# Patient Record
Sex: Male | Born: 1940
Health system: Southern US, Community
[De-identification: ages and names within clinical notes are randomized; demographics above are authoritative.]

## PROBLEM LIST (undated history)

## (undated) DIAGNOSIS — F32A Depression, unspecified: Secondary | ICD-10-CM

## (undated) DIAGNOSIS — M1990 Unspecified osteoarthritis, unspecified site: Secondary | ICD-10-CM

## (undated) DIAGNOSIS — F329 Major depressive disorder, single episode, unspecified: Secondary | ICD-10-CM

## (undated) DIAGNOSIS — E785 Hyperlipidemia, unspecified: Secondary | ICD-10-CM

## (undated) DIAGNOSIS — E119 Type 2 diabetes mellitus without complications: Secondary | ICD-10-CM

## (undated) DIAGNOSIS — K219 Gastro-esophageal reflux disease without esophagitis: Secondary | ICD-10-CM

## (undated) DIAGNOSIS — I10 Essential (primary) hypertension: Secondary | ICD-10-CM

## (undated) DIAGNOSIS — N319 Neuromuscular dysfunction of bladder, unspecified: Secondary | ICD-10-CM

## (undated) HISTORY — PX: SKIN GRAFT SPLIT THICKNESS LEG / FOOT: SUR1303

---

## 2008-04-20 ENCOUNTER — Ambulatory Visit (HOSPITAL_BASED_OUTPATIENT_CLINIC_OR_DEPARTMENT_OTHER): Admission: RE | Admit: 2008-04-20 | Discharge: 2008-04-20 | Payer: Self-pay | Admitting: Urology

## 2008-04-27 ENCOUNTER — Ambulatory Visit: Payer: Self-pay | Admitting: Surgery

## 2008-06-16 ENCOUNTER — Ambulatory Visit: Payer: Self-pay | Admitting: Pulmonary Disease

## 2008-06-16 DIAGNOSIS — E119 Type 2 diabetes mellitus without complications: Secondary | ICD-10-CM | POA: Insufficient documentation

## 2008-06-16 DIAGNOSIS — E785 Hyperlipidemia, unspecified: Secondary | ICD-10-CM | POA: Insufficient documentation

## 2008-06-16 DIAGNOSIS — G4733 Obstructive sleep apnea (adult) (pediatric): Secondary | ICD-10-CM

## 2008-07-11 ENCOUNTER — Ambulatory Visit (HOSPITAL_BASED_OUTPATIENT_CLINIC_OR_DEPARTMENT_OTHER): Admission: RE | Admit: 2008-07-11 | Discharge: 2008-07-11 | Payer: Self-pay | Admitting: Pulmonary Disease

## 2008-07-11 ENCOUNTER — Encounter: Payer: Self-pay | Admitting: Pulmonary Disease

## 2008-07-27 ENCOUNTER — Encounter: Admission: RE | Admit: 2008-07-27 | Discharge: 2008-07-27 | Payer: Self-pay | Admitting: Surgery

## 2008-07-27 ENCOUNTER — Ambulatory Visit: Payer: Self-pay | Admitting: Surgery

## 2008-08-03 ENCOUNTER — Ambulatory Visit: Payer: Self-pay | Admitting: Pulmonary Disease

## 2008-08-11 ENCOUNTER — Ambulatory Visit: Payer: Self-pay | Admitting: Pulmonary Disease

## 2008-09-13 ENCOUNTER — Ambulatory Visit: Payer: Self-pay | Admitting: Pulmonary Disease

## 2008-09-17 ENCOUNTER — Encounter: Payer: Self-pay | Admitting: Pulmonary Disease

## 2008-10-20 ENCOUNTER — Telehealth (INDEPENDENT_AMBULATORY_CARE_PROVIDER_SITE_OTHER): Payer: Self-pay | Admitting: *Deleted

## 2008-10-22 ENCOUNTER — Ambulatory Visit: Payer: Self-pay | Admitting: Pulmonary Disease

## 2008-10-29 ENCOUNTER — Telehealth: Payer: Self-pay | Admitting: Pulmonary Disease

## 2009-04-05 ENCOUNTER — Ambulatory Visit: Payer: Self-pay | Admitting: Surgery

## 2009-04-05 ENCOUNTER — Encounter: Admission: RE | Admit: 2009-04-05 | Discharge: 2009-04-05 | Payer: Self-pay | Admitting: Surgery

## 2010-04-04 ENCOUNTER — Encounter: Admission: RE | Admit: 2010-04-04 | Discharge: 2010-04-04 | Payer: Self-pay | Admitting: Surgery

## 2010-04-11 ENCOUNTER — Ambulatory Visit: Payer: Self-pay | Admitting: Surgery

## 2010-07-28 IMAGING — CT CT CHEST W/O CM
2 of 3 series · 13 of 30 positions shown, 15 images · non-contrast
Comparison: CT chest of 04/05/2009 and CT chest of 07/27/2008

CLINICAL DATA: Follow up of right lung nodule

CT CHEST WITHOUT CONTRAST
TECHNIQUE: Multidetector CT imaging of the chest was performed
following the standard protocol without IV contrast.

[Series 3: routine chest · axial · 0.78mm/px · z∈[-195,-15]mm · 5 of 55 slices shown, 7 images]
[im 10/55  mediastinal]
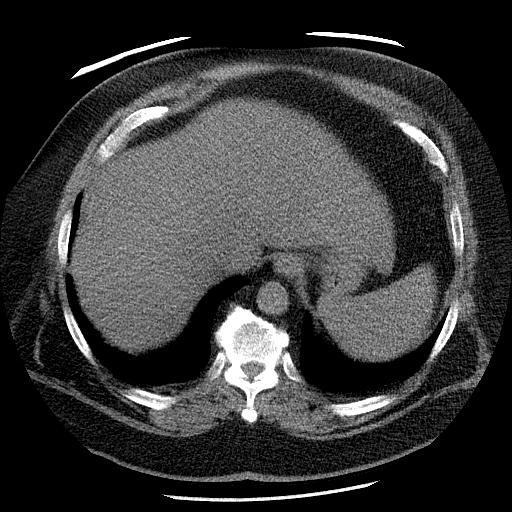
[im 10/55  lung]
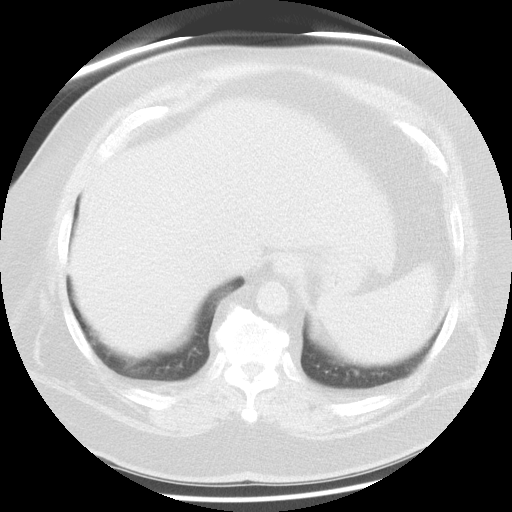
[im 19/55  lung]
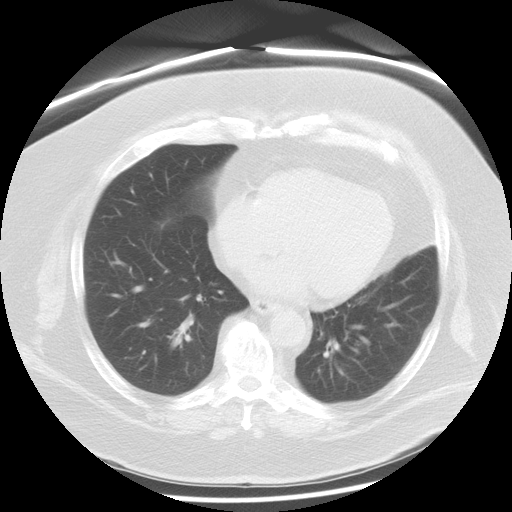
[im 28/55  lung]
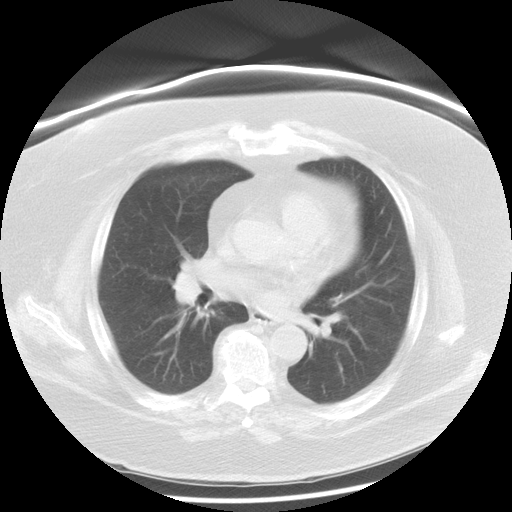
[im 37/55  lung]
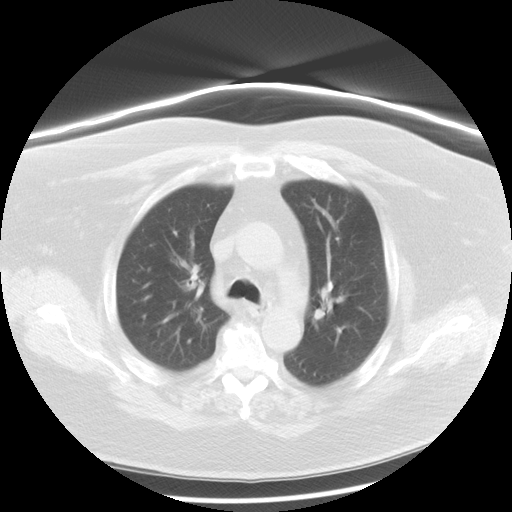
[im 46/55  mediastinal]
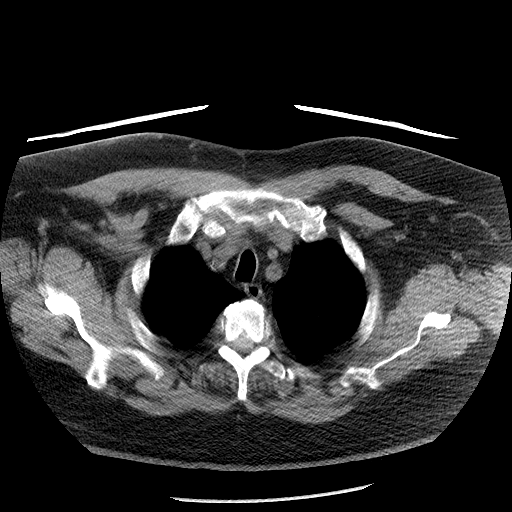
[im 46/55  lung]
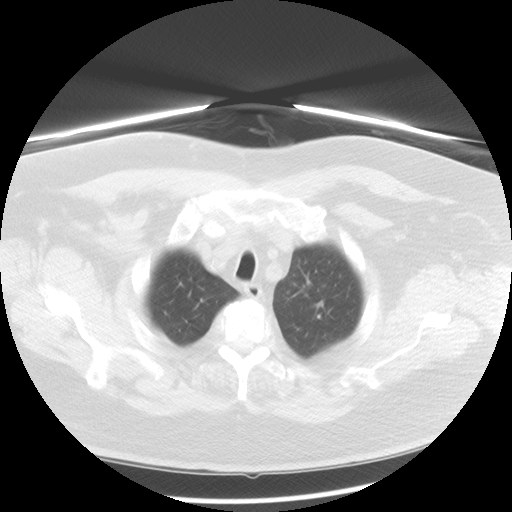

[Series 602: sagittal body · sagittal · 0.78mm/px · 8 of 161 slices shown]
[im 17/161  mediastinal]
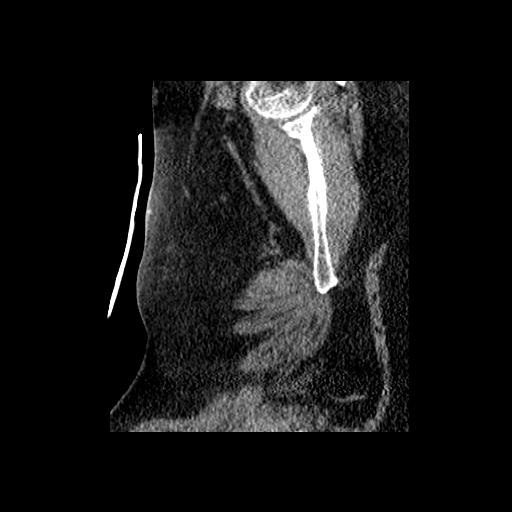
[im 33/161  mediastinal]
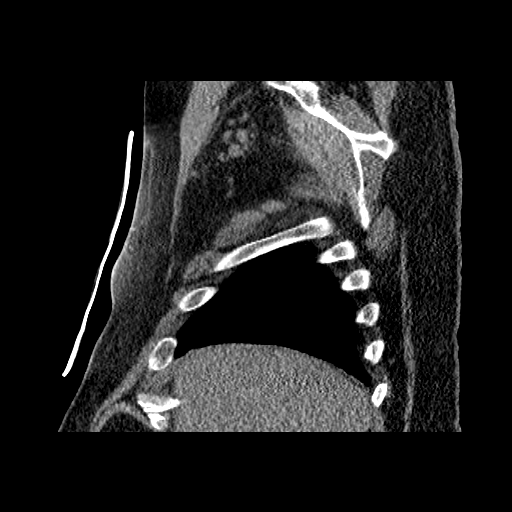
[im 49/161  mediastinal]
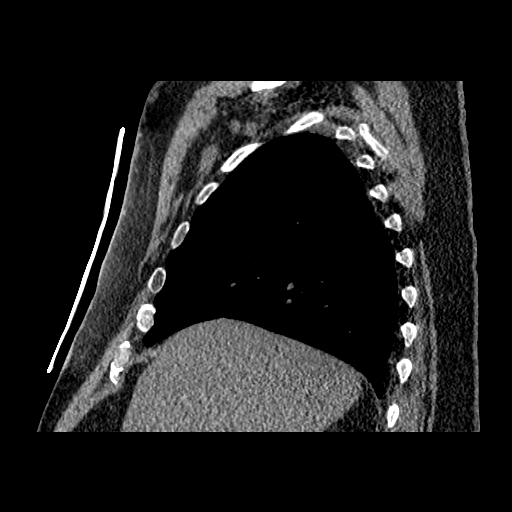
[im 73/161  mediastinal]
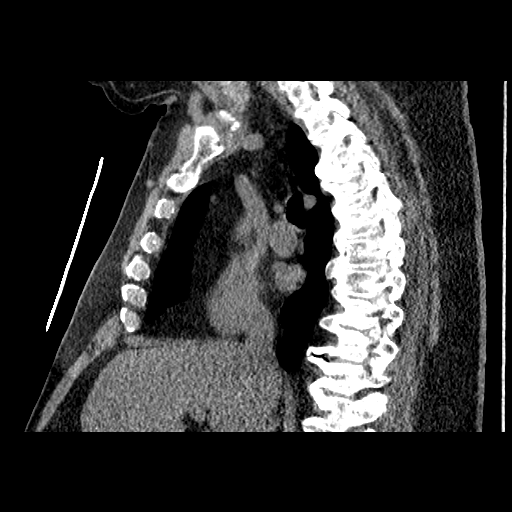
[im 89/161  mediastinal]
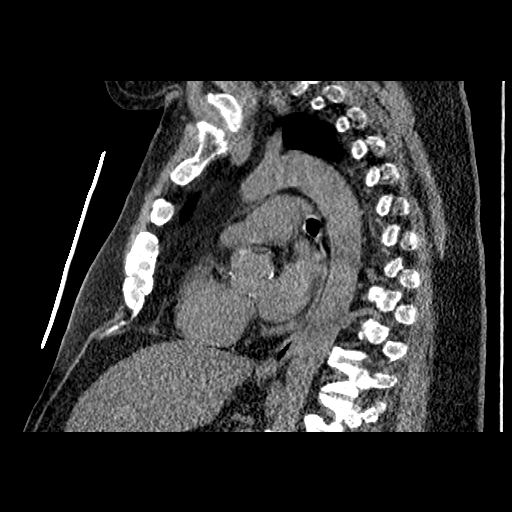
[im 113/161  mediastinal]
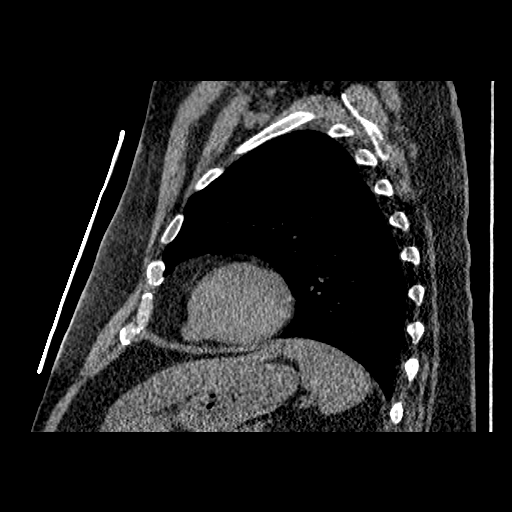
[im 129/161  mediastinal]
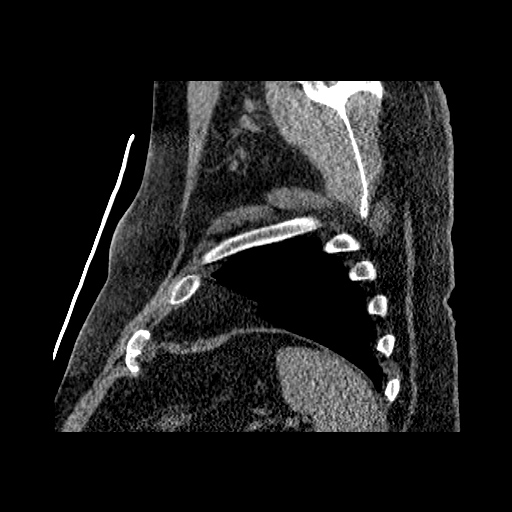
[im 145/161  mediastinal]
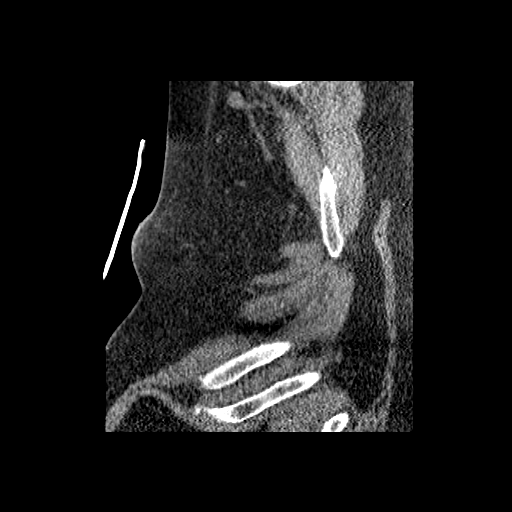

[13 of 30 positions shown; findings below may reference images not displayed]

FINDINGS: The well circumscribed 11 mm nodule in the periphery of
the right lower lobe laterally appears completely stable measuring
11 x 10 mm.  No other pulmonary nodule is seen.  No infiltrate or
effusion is noted.

On soft tissue window images, calcified right hilar nodes are again
noted most consistent with prior granulomatous disease.  No
mediastinal or hilar adenopathy is seen.  The heart is mildly
enlarged and stable.
IMPRESSION: 1.  Stable 11 mm nodule in the right lower lobe most consistent
with a benign process in view of stability over approximately 2
years.
2.  Calcified right hilar nodes suggest prior granulomatous
disease.

## 2011-01-23 NOTE — Assessment & Plan Note (Signed)
OFFICE VISIT   Phillip, Grant  DOB:  05/16/1941                                        April 05, 2009  CHART #:  [id number]   HISTORY:  The patient returns today for followup of a small subpleural  nodule noted in the right lower lobe at the right costophrenic angle.  I  last saw him on July 27, 2008, at which time chest CT scan showed no  change in the 12-mm smoothly marginated pleural-based in the lateral  right lower lobe compared to prior CT scan of August 2009.  I  recommended followup in 6 months with a CT scan.  Since that time, the  patient has had no new pulmonary complaints.  His main limitation seems  to be arthritis in his hips, difficulty walking.  He denies any fever or  chills, had no cough or sputum production.  He has had no hemoptysis.   PHYSICAL EXAMINATION:  Blood pressure 151/84, his pulse is 78 and  regular, respiratory rate is 18 and unlabored.  Oxygen saturation on  room air is 96%.  He looks well.  Cardiac exam shows a regular rate and  rhythm with normal heart sounds.  His lung exam is clear.   DIAGNOSTICS:  A followup chest CT scan today shows no change in the 12-  mm right lower lobe subpleural nodule.  The margin of the lesion is  slightly more irregular than on the previous CT scan and in which it was  very smoothly marginated.  There are no other abnormalities seen on the  CT scan.   IMPRESSION:  The right lower lobe lung nodule has not changed in size  but does have a slightly more irregular margin.  Radiology has  recommended continued followup with chest CT scan.  Therefore, I will  plan to see the patient back in 1 year for repeat CT scan of the chest  to reevaluate this lesion.   Phillip Grant, M.D.  Electronically Signed   BB/MEDQ  D:  04/05/2009  T:  04/06/2009  Job:  [id number]

## 2011-01-23 NOTE — Procedures (Signed)
Phillip Grant, Phillip Grant               ACCOUNT NO.:  [id number]   MEDICAL RECORD NO.:  [id number]          PATIENT TYPE:  OUT   LOCATION:  SLEEP CENTER                 FACILITY:  [facility]   PHYSICIAN:  Barbaraann Share, MD,FCCPDATE OF BIRTH:  1940/12/19   DATE OF STUDY:  07/11/2008                            NOCTURNAL POLYSOMNOGRAM   REFERRING PHYSICIAN:  Barbaraann Share, MD,FCCP   REFERRING PHYSICIAN:  Barbaraann Share, MD, FCCP   INDICATION FOR THE STUDY:  Hypersomnia with sleep apnea.   EPWORTH SCORE:  10.   SLEEP ARCHITECTURE:  The patient had a total sleep time of only 135  minutes with no slow wave sleep or REM.  Sleep onset latency was very  prolonged at 106 minutes, and sleep efficiency was extremely poor at  39%.   RESPIRATORY DATA:  The patient was found to have 1 obstructive apnea and  22 hypopneas for an apnea-hypopnea index of 10 events per hour.  The  events were not positional.  There was loud snoring noted throughout.  It should be noted the patient only had a total sleep time of 135  minutes with extreme sleep fragmentation.  This led to many more  obstructive events that were not scored secondary to the epoch being  scored as wake.   OXYGEN DATA:  There was O2 desaturation as low as 84%.   CARDIAC DATA:  No clinically significant arrhythmias were seen.   MOVEMENT/PARASOMNIA:  The patient had no leg jerks or abnormal behaviors  noted.   IMPRESSION/RECOMMENDATION:  Mild obstructive sleep apnea/hypopnea  syndrome with an apnea-hypopnea index of 10 events per hour and O2  desaturation as low as 84%.  However, the patient had a total sleep time  of only 135 minutes, and there were many more obstructive events that  were not scored secondary to frequent wakenings on individual epochs.  Treatment for this degree of sleep apnea can include weight loss alone  if applicable, upper  airway surgery, oral appliance, and also continuous positive airway  pressure.  However, I  caution the patient's sleep apnea is probably  being under estimated by this study.      Barbaraann Share, MD,FCCP  Diplomate, American Board of Sleep  Medicine  Electronically Signed     [facility]/MEDQ  D:  08/03/2008 08:33:59  T:  08/03/2008 21:03:27  Job:  [id number]

## 2011-01-23 NOTE — Op Note (Signed)
Phillip Grant, Phillip Grant               ACCOUNT NO.:  [id number]   MEDICAL RECORD NO.:  [id number]          PATIENT TYPE:  AMB   LOCATION:  [facility]                         FACILITY:  [facility]   PHYSICIAN:  Martina Sinner, MD DATE OF BIRTH:  Mar 30, 1941   DATE OF PROCEDURE:  04/20/2008  DATE OF DISCHARGE:                               OPERATIVE REPORT   PREOPERATIVE DIAGNOSIS:  Urethral stricture.   POSTOPERATIVE DIAGNOSIS:  Urethral stricture.   PROCEDURES:  1. Retrograde urethrogram.  2. Urethral dilation x2.  3. Ureteroscopy.  4. Foley catheter placement over a wire.  5. Intraoperative fluoroscopy with interpretation.   ATTENDING PHYSICIAN:  Martina Sinner, MD.   RESIDENT PHYSICIAN:  Dr. Delman Kitten.   ANESTHESIA:  General.   INDICATIONS FOR PROCEDURE:  Phillip Grant is a 70 year old white male with  past medical history positive for urethral reconstruction with full  length tubularized graft.  He continues to be symptomatic with his  urethral stricture and noted to have medial stenosis on exam.  Likewise,  the above-stated procedures were explained to him in detail  preoperatively and risks, benefits, consequences and concerns were  discussed.  Informed consent was obtained.   PROCEDURE IN DETAIL:  The patient was brought to the operating room,  placed in supine position.  He was correctly identified by his wristband  and appropriate time-out was taken.  IV antibiotics were given.  A  general anesthesia was delivered.  Once adequately anesthetized, he was  placed in dorsal lithotomy position with great care taken to minimize  the risk of peripheral neuropathy or compartment syndrome.  His perineum  was prepped and draped sterilely.  We began our procedure by performing  a thorough physical exam.  He did have evidence of significant urethral  meatal stenosis.  It was also obvious that he has had the above stated  penile/urethral reconstruction.  He does have a significant  amount of  lymphedema to the ventral aspect of his distal flap.  The glans appears  normal with exception of the meatus.  We were able to cannulate his  urethral meatus with a 5-French open-ended catheter and perform  retrograde urethrogram.  The urethrogram did not show any frank  strictures, however, it did show a poorly distensible urethra throughout  its entire length.  We then were able to advance the a sensor tip  guidewire per urethra into his bladder through the open-ended catheter.  Once appropriately placed, the end-hole catheter was removed and we  advanced a 15 cm 20 atmosphere dilating balloon over the wire and  directed it to the level of the proximal urethra just distal to the  membranous urethra.  We used fluoroscopy to guide our placement.  Once  appropriately positioned, we inflated the balloon to 16 atmospheres and  held it there for 5 minutes.  We then let down the balloon and moved it  in an antegrade fashion to where the balloon would cross the urethral  meatus.  We reinflated the balloon held it there for a few minutes and  then let down the balloon, removed it and  then left the wire in place.  We then performed rigid urethroscopy with a 17-French rigid cystoscopic  sheath, 30 degree lens and sterile water as our irrigant.  The urethral  meatus and fossa navicularis were quite tight but proximal to this the  urethra was more accommodating.  There was no evidence of stricture.  There was minimal  evidence of urethral injury from the dilation.  His  bladder neck was patent and his bladder was full of urine.  We then  removed the cystoscope leaving the guidewire behind and then  successfully placed a 16-French council tip catheter over the guidewire,  inflated the balloon with 10 mL sterile water.  This marked the end of  our procedure.  He tolerated the procedure well.  He awoke from  anesthesia and was taken to the recovery room in stable condition.  Dr.  Sherron Monday  was present and participated in all aspects of the case.     ______________________________  Delman Kitten, M.D.      Martina Sinner, MD  Electronically Signed    DW/MEDQ  D:  04/20/2008  T:  04/20/2008  Job:  [id number]

## 2011-01-23 NOTE — Consult Note (Signed)
NEW PATIENT CONSULTATION   Phillip Grant, BUCHANON  DOB:  10-Sep-1941                                        April 27, 2008  CHART #:  [id number]   REASON FOR CONSULTATION:  Right lower lobe lung nodule.   CLINICAL HISTORY:  I was asked by Dr. Sherron Monday to evaluate the patient  for a small right lower lobe lung nodule.  He is a 70 year old obese  white male, who has been followed by Dr. Sherron Monday for complex urethral  stricture disease.  He underwent a CT scan of the abdomen and his workup  which happened to show a 11 mm x 12 mm subpleural nodule in the right  costophrenic angle.  He subsequently underwent a repeat CT scan of the  chest on April 14, 2008, to follow up on this nodule and this showed an  11 x 12 mm subpleural nodule at the right lateral lung base.  This was a  noncalcified nodule.  There is no axillary, hilar, or mediastinal  adenopathy.  There are no other abnormalities in the lung noted.  Coronary calcification was noted.  Gallstones were seen.   REVIEW OF SYSTEMS:  His review of systems is as follows:  General:  He  denies any fever or chills.  He has had some weight gain recently.  He  reports that he weighed over 400 pounds in the past, but has got his  weight down to about 180 pounds.  He has had no fever or chills.  He  reports no change in his appetite.  Eyes:  Negative.  ENT:  He has  undergone prior surgery on his sinuses and his palate for treatment of  sleep apnea.  This resulted in improved symptoms.  Endocrine:  He has  adult-onset diabetes.  His last hemoglobin A1c was about 6.4.  He denies  hypothyroidism.  Cardiovascular:  He denies chest pain or pressure, but  does have some exertional shortness of breath as well as orthopnea.  He  has noted peripheral edema.  He has noticed fatigue over the past year.  Respiratory:  He denies cough and sputum production.  He had no  hemoptysis.  GI:  He denies nausea or vomiting.  Denies melena and  bright red blood per rectum.  GU:  He does have dysuria.  He denies  hematuria.  He has urinary hesitancy.  Vascular:  He denies claudication  and phlebitis.  Neurological:  He denies any focal weakness or numbness.  Denies dizziness and syncope.  He has never had TIA or stroke.  Psychiatric:  Negative.  Hematological:  Negative.   ALLERGIES:  None.   MEDICATIONS:  Metformin 850 mg b.i.d., glipizide 2.5 mg b.i.d., Cozaar  50 mg daily, Crestor 50 mg daily, aspirin 81 mg daily, and Cipro 500 mg  b.i.d..   PAST MEDICAL HISTORY:  Significant for adult-onset diabetes.  He is  treated by a physician at the [place].  He has a history of hyperlipidemia.  He has a history of sleep apnea, but he has never been on CPAP.  He is  status post sinus and palate surgery in the past.  He is status post  tonsillectomy.   FAMILY HISTORY:  Strongly positive for cardiac disease prematurely.  His  father died in his 75s of myocardial infarction and sister died in her  52s.   SOCIAL HISTORY:  He is married and lives with his wife.  He has a remote  smoker.  He denies alcohol abuse.  He is retired from [employer].   PHYSICAL EXAMINATION:  VITAL SIGNS:  Blood pressure is 123/78.  His  pulse is 77 and regular.  Respiratory rate is 18 and unlabored.  Oxygen  saturation on room air is 95%.  GENERAL:  He is an obese white male in no distress.  HEENT:  Normocephalic and atraumatic.  Pupils are equal and reactive to  light and accommodation.  Extraocular muscles are intact.  His throat is  clear.  NECK:  Normal carotid pulses bilaterally.  There are no bruits.  There  is no adenopathy or thyromegaly.  CARDIAC:  Regular rate and rhythm with normal S1 and S2.  There is no  murmur, rub, or gallop.  LUNGS:  Clear.  ABDOMEN:  Active bowel sounds.  His abdomen is soft, obese, and  nontender.  There are no palpable masses or organomegaly.  EXTREMITIES:  Mild bilateral lower extremity edema.  Pedal pulses palpable   bilaterally.  SKIN:  Warm and dry.  NEUROLOGIC:  Alert and oriented x3.  Motor and sensory exams grossly  normal.   IMPRESSION:  The patient has an 11 x 12 mm right lower lobe subpleural  nodule that is noncalcified.  Given his smoking history, I think there  also has to be a suspicion that this could be a small lung cancer,  although certainly could be a scar or benign nodule.  We will plan to do  a repeat CT scan of the chest in 3 months to reevaluate this before  proceeding with head scan for further intervention.  He also has  symptoms that are suggestive of possible significant cardiac disease and  given his CT scan showing coronary calcifications as well as a strong  family history of heart disease at premature age and multiple cardiac  risk factors, I think it would be best to have him evaluated by  Cardiology.  I have scheduled an appointment for him to see Dr. Peter  Swaziland for further evaluation of this.  He does have a history of sleep  apnea symptoms, although he has never had a formal sleep study and has  never been on CPAP.  I have referred him to Dr. Marcelyn Bruins of [facility] for further evaluation of this.  I will plan to see  him back in 3 months with a repeat CT scan of the chest.   Evelene Croon, M.D.  Electronically Signed   BB/MEDQ  D:  04/27/2008  T:  04/28/2008  Job:  [id number]   cc:   Martina Sinner, MD  Peter M. Swaziland, M.D.

## 2011-01-23 NOTE — Assessment & Plan Note (Signed)
OFFICE VISIT   Phillip Grant, Phillip Grant  DOB:  [birth year]                                        April 11, 2010  CHART #:  [id number]   HISTORY:  The patient returns today for followup of a small subpleural  nodule in the right lower lobe at the right costophrenic angle.  I last  saw him on April 05, 2009, at which time CT scan showed that the nodule  had not changed in size and was still about 12 mm.  The margin of lesion  was slightly more irregular at that time than on the previous CT scan in  which it was very smoothly marginated.  We decided to repeat a scan in 1  year.  Over the past year, his only complaint was of worsening  arthritis, which was limiting his mobility.  He denies any cough or  sputum production.  He has had no chest pain or pressure.   PHYSICAL EXAMINATION:  His blood pressure 143/79, his pulse 94 and  regular, and respiratory rate is 16 and unlabored.  He looks well.  Cardiac exam shows regular rate and rhythm with normal heart sounds.  His lung exam is clear.   DIAGNOSTIC TESTS:  A followup chest CT scan today shows no change in the  right lower lobe subpleural nodule, measuring about 11-12 mm.  The  margin of the lesion is fairly smooth.  There are no other abnormalities  seen on CT scan.  Radiology felt this is most likely a benign nodule.   IMPRESSION:  The right lower lobe lung nodule has not changed in size.  I agree with Radiology that this is most likely a benign nodule.  I  would plan to repeat a CT scan in 1 year and if there is no change at  that time, then I would not continue to follow this lesion.  We will  schedule a repeat CT scan for 1 year and see him back at that time.   Evelene Croon, M.D.  Electronically Signed   BB/MEDQ  D:  04/11/2010  T:  04/12/2010  Job:  [id number]

## 2011-01-23 NOTE — Assessment & Plan Note (Signed)
OFFICE VISIT   Phillip, Grant  DOB:  03/11/1935                                        July 27, 2008  CHART #:  [id number]   The patient returns today for followup of a right lower lobe lung  nodule.  I initially saw him in consultation on April 27, 2008, for  evaluation of a 11 x 12-mm subpleural nodule noted in the right lower  lobe at the right costophrenic angle.  He also had symptoms that suggest  a possible significant cardiac disease as well as coronary  calcifications noted on CT scan with the strong family history of heart  disease at a premature age as well as multiple cardiac risk factors.  He  was referred to Dr. Peter Swaziland and underwent a stress test, which was  negative.  He was also seen by Dr. Marcelyn Bruins for evaluation of sleep  apnea symptoms and said that he underwent a sleep study, but has not  found out the results of that yet.  He returns today for followup of his  lung nodule.   PHYSICAL EXAMINATION:  VITAL SIGNS:  Blood pressure is 141/74, his pulse  is 74 and regular.  Respiratory rate is 18 and unlabored.  Oxygen  saturation on room air is 95%.  GENERAL:  He looks well.  LUNGS:  Clear.  NECK:  There is no cervical, supraclavicular, or axillary adenopathy.   CT scan of the chest today shows no change in the 12-mm smoothly  marginated pleural-based nodule in the lateral right lower lobe.  There  may be partial calcification centrally.  He does have some calcified  right hilar lymph nodes.   IMPRESSION:  There is no significant change in this right lower lobe  lung nodule and I suspect it is probably benign since it is very  smoothly marginated and unchanged in size with some suggestion of  partial calcification.  I have recommended that we do a followup CT scan  of the chest in about 6 months to further evaluate this.  He is in  agreement with that plan and I will see  him back in about 6 months after that scan was  done.  He will continue  to follow up with Dr. Shelle Iron concerning his sleep study.   Evelene Croon, M.D.  Electronically Signed   BB/MEDQ  D:  07/27/2008  T:  07/28/2008  Job:  [id number]   cc:   Barbaraann Share, MD,FCCP  Leighton Roach McDiarmid, M.D.

## 2011-03-22 ENCOUNTER — Other Ambulatory Visit: Payer: Self-pay | Admitting: Surgery

## 2011-03-22 DIAGNOSIS — R911 Solitary pulmonary nodule: Secondary | ICD-10-CM

## 2011-05-01 ENCOUNTER — Ambulatory Visit: Payer: Self-pay | Admitting: Surgery

## 2011-05-01 ENCOUNTER — Other Ambulatory Visit: Payer: Self-pay

## 2011-05-28 NOTE — Assessment & Plan Note (Signed)
OFFICE VISIT  Phillip Grant, Phillip Grant DOB:  May 29, 1941                                        April 12, 2011 CHART #:  [id number]  The patient was scheduled to return to see me this week for followup of a right lower lobe subpleural nodule which have been stable.  I saw him 1 year ago and this lesion had been stable since July 2010.  I was planning to see him back this year for followup CT scan and if there is no change, than we would not continue following this lesion.  The patient called the office and so that he was cancelling his appointment and CT scan did not want to reschedule.  He had no specific reason. Luckily, I think this lesion is most likely a benign nodule since they have been stable for several years.  Evelene Croon, M.D. Electronically Signed  BB/MEDQ  D:  04/12/2011  T:  04/12/2011  Job:  [id number]

## 2011-06-08 LAB — POCT I-STAT 4, (NA,K, GLUC, HGB,HCT): Sodium: 139

## 2011-06-08 LAB — GLUCOSE, CAPILLARY: Glucose-Capillary: 148 — ABNORMAL HIGH

## 2013-09-10 HISTORY — PX: LAPAROSCOPIC COLOSTOMY: SHX1921

## 2016-09-12 DIAGNOSIS — I10 Essential (primary) hypertension: Secondary | ICD-10-CM | POA: Diagnosis not present

## 2016-09-13 DIAGNOSIS — L89154 Pressure ulcer of sacral region, stage 4: Secondary | ICD-10-CM | POA: Diagnosis not present

## 2016-09-20 DIAGNOSIS — L89154 Pressure ulcer of sacral region, stage 4: Secondary | ICD-10-CM | POA: Diagnosis not present

## 2016-09-21 DIAGNOSIS — G894 Chronic pain syndrome: Secondary | ICD-10-CM | POA: Diagnosis not present

## 2016-09-21 DIAGNOSIS — E785 Hyperlipidemia, unspecified: Secondary | ICD-10-CM | POA: Diagnosis not present

## 2016-09-21 DIAGNOSIS — E119 Type 2 diabetes mellitus without complications: Secondary | ICD-10-CM | POA: Diagnosis not present

## 2016-09-21 DIAGNOSIS — R339 Retention of urine, unspecified: Secondary | ICD-10-CM | POA: Diagnosis not present

## 2016-09-21 DIAGNOSIS — J9811 Atelectasis: Secondary | ICD-10-CM | POA: Diagnosis not present

## 2016-10-03 DIAGNOSIS — L89154 Pressure ulcer of sacral region, stage 4: Secondary | ICD-10-CM | POA: Diagnosis not present

## 2016-10-03 DIAGNOSIS — I10 Essential (primary) hypertension: Secondary | ICD-10-CM | POA: Diagnosis not present

## 2016-10-03 DIAGNOSIS — R300 Dysuria: Secondary | ICD-10-CM | POA: Diagnosis not present

## 2016-10-11 DIAGNOSIS — L89154 Pressure ulcer of sacral region, stage 4: Secondary | ICD-10-CM | POA: Diagnosis not present

## 2016-10-18 DIAGNOSIS — L89154 Pressure ulcer of sacral region, stage 4: Secondary | ICD-10-CM | POA: Diagnosis not present

## 2016-10-25 DIAGNOSIS — L89154 Pressure ulcer of sacral region, stage 4: Secondary | ICD-10-CM | POA: Diagnosis not present

## 2016-11-01 DIAGNOSIS — L89154 Pressure ulcer of sacral region, stage 4: Secondary | ICD-10-CM | POA: Diagnosis not present

## 2016-11-07 DIAGNOSIS — I10 Essential (primary) hypertension: Secondary | ICD-10-CM | POA: Diagnosis not present

## 2016-11-12 DIAGNOSIS — T83511A Infection and inflammatory reaction due to indwelling urethral catheter, initial encounter: Secondary | ICD-10-CM | POA: Diagnosis not present

## 2016-11-12 DIAGNOSIS — N390 Urinary tract infection, site not specified: Secondary | ICD-10-CM | POA: Diagnosis not present

## 2016-11-12 DIAGNOSIS — R339 Retention of urine, unspecified: Secondary | ICD-10-CM | POA: Diagnosis not present

## 2016-11-28 DIAGNOSIS — R339 Retention of urine, unspecified: Secondary | ICD-10-CM | POA: Diagnosis not present

## 2016-11-28 DIAGNOSIS — E785 Hyperlipidemia, unspecified: Secondary | ICD-10-CM | POA: Diagnosis not present

## 2016-11-28 DIAGNOSIS — M1990 Unspecified osteoarthritis, unspecified site: Secondary | ICD-10-CM | POA: Diagnosis not present

## 2016-11-28 DIAGNOSIS — L89154 Pressure ulcer of sacral region, stage 4: Secondary | ICD-10-CM | POA: Diagnosis not present

## 2016-12-27 DIAGNOSIS — F329 Major depressive disorder, single episode, unspecified: Secondary | ICD-10-CM | POA: Diagnosis not present

## 2017-01-18 DIAGNOSIS — E039 Hypothyroidism, unspecified: Secondary | ICD-10-CM | POA: Diagnosis not present

## 2017-01-18 DIAGNOSIS — G894 Chronic pain syndrome: Secondary | ICD-10-CM | POA: Diagnosis not present

## 2017-01-18 DIAGNOSIS — E118 Type 2 diabetes mellitus with unspecified complications: Secondary | ICD-10-CM | POA: Diagnosis not present

## 2017-01-18 DIAGNOSIS — E785 Hyperlipidemia, unspecified: Secondary | ICD-10-CM | POA: Diagnosis not present

## 2017-01-18 DIAGNOSIS — M1611 Unilateral primary osteoarthritis, right hip: Secondary | ICD-10-CM | POA: Diagnosis not present

## 2017-03-11 DIAGNOSIS — F329 Major depressive disorder, single episode, unspecified: Secondary | ICD-10-CM | POA: Diagnosis not present

## 2017-03-13 DIAGNOSIS — I10 Essential (primary) hypertension: Secondary | ICD-10-CM | POA: Diagnosis not present

## 2017-03-13 DIAGNOSIS — Z79899 Other long term (current) drug therapy: Secondary | ICD-10-CM | POA: Diagnosis not present

## 2017-04-16 DIAGNOSIS — M150 Primary generalized (osteo)arthritis: Secondary | ICD-10-CM | POA: Diagnosis not present

## 2017-04-16 DIAGNOSIS — G8929 Other chronic pain: Secondary | ICD-10-CM | POA: Diagnosis not present

## 2017-04-16 DIAGNOSIS — M25551 Pain in right hip: Secondary | ICD-10-CM | POA: Diagnosis not present

## 2017-04-22 DIAGNOSIS — M150 Primary generalized (osteo)arthritis: Secondary | ICD-10-CM | POA: Diagnosis not present

## 2017-04-22 DIAGNOSIS — G8929 Other chronic pain: Secondary | ICD-10-CM | POA: Diagnosis not present

## 2017-04-22 DIAGNOSIS — M25551 Pain in right hip: Secondary | ICD-10-CM | POA: Diagnosis not present

## 2017-04-23 DIAGNOSIS — M6281 Muscle weakness (generalized): Secondary | ICD-10-CM | POA: Diagnosis not present

## 2017-04-23 DIAGNOSIS — R2689 Other abnormalities of gait and mobility: Secondary | ICD-10-CM | POA: Diagnosis not present

## 2017-04-23 DIAGNOSIS — R278 Other lack of coordination: Secondary | ICD-10-CM | POA: Diagnosis not present

## 2017-04-25 DIAGNOSIS — R278 Other lack of coordination: Secondary | ICD-10-CM | POA: Diagnosis not present

## 2017-04-25 DIAGNOSIS — R2689 Other abnormalities of gait and mobility: Secondary | ICD-10-CM | POA: Diagnosis not present

## 2017-04-25 DIAGNOSIS — M6281 Muscle weakness (generalized): Secondary | ICD-10-CM | POA: Diagnosis not present

## 2017-04-29 DIAGNOSIS — R2689 Other abnormalities of gait and mobility: Secondary | ICD-10-CM | POA: Diagnosis not present

## 2017-04-29 DIAGNOSIS — R278 Other lack of coordination: Secondary | ICD-10-CM | POA: Diagnosis not present

## 2017-04-29 DIAGNOSIS — M6281 Muscle weakness (generalized): Secondary | ICD-10-CM | POA: Diagnosis not present

## 2017-05-01 DIAGNOSIS — R2689 Other abnormalities of gait and mobility: Secondary | ICD-10-CM | POA: Diagnosis not present

## 2017-05-01 DIAGNOSIS — M6281 Muscle weakness (generalized): Secondary | ICD-10-CM | POA: Diagnosis not present

## 2017-05-01 DIAGNOSIS — R278 Other lack of coordination: Secondary | ICD-10-CM | POA: Diagnosis not present

## 2017-05-03 DIAGNOSIS — R278 Other lack of coordination: Secondary | ICD-10-CM | POA: Diagnosis not present

## 2017-05-03 DIAGNOSIS — R2689 Other abnormalities of gait and mobility: Secondary | ICD-10-CM | POA: Diagnosis not present

## 2017-05-03 DIAGNOSIS — M6281 Muscle weakness (generalized): Secondary | ICD-10-CM | POA: Diagnosis not present

## 2017-05-06 DIAGNOSIS — R278 Other lack of coordination: Secondary | ICD-10-CM | POA: Diagnosis not present

## 2017-05-06 DIAGNOSIS — M6281 Muscle weakness (generalized): Secondary | ICD-10-CM | POA: Diagnosis not present

## 2017-05-06 DIAGNOSIS — R2689 Other abnormalities of gait and mobility: Secondary | ICD-10-CM | POA: Diagnosis not present

## 2017-05-07 DIAGNOSIS — I10 Essential (primary) hypertension: Secondary | ICD-10-CM | POA: Diagnosis not present

## 2017-05-07 DIAGNOSIS — L89154 Pressure ulcer of sacral region, stage 4: Secondary | ICD-10-CM | POA: Diagnosis not present

## 2017-05-07 DIAGNOSIS — K219 Gastro-esophageal reflux disease without esophagitis: Secondary | ICD-10-CM | POA: Diagnosis not present

## 2017-05-07 DIAGNOSIS — R339 Retention of urine, unspecified: Secondary | ICD-10-CM | POA: Diagnosis not present

## 2017-05-08 DIAGNOSIS — R2689 Other abnormalities of gait and mobility: Secondary | ICD-10-CM | POA: Diagnosis not present

## 2017-05-08 DIAGNOSIS — R278 Other lack of coordination: Secondary | ICD-10-CM | POA: Diagnosis not present

## 2017-05-08 DIAGNOSIS — M6281 Muscle weakness (generalized): Secondary | ICD-10-CM | POA: Diagnosis not present

## 2017-05-10 DIAGNOSIS — R278 Other lack of coordination: Secondary | ICD-10-CM | POA: Diagnosis not present

## 2017-05-10 DIAGNOSIS — M6281 Muscle weakness (generalized): Secondary | ICD-10-CM | POA: Diagnosis not present

## 2017-05-10 DIAGNOSIS — R2689 Other abnormalities of gait and mobility: Secondary | ICD-10-CM | POA: Diagnosis not present

## 2017-05-14 DIAGNOSIS — M6281 Muscle weakness (generalized): Secondary | ICD-10-CM | POA: Diagnosis not present

## 2017-05-14 DIAGNOSIS — R278 Other lack of coordination: Secondary | ICD-10-CM | POA: Diagnosis not present

## 2017-05-15 DIAGNOSIS — R278 Other lack of coordination: Secondary | ICD-10-CM | POA: Diagnosis not present

## 2017-05-15 DIAGNOSIS — M6281 Muscle weakness (generalized): Secondary | ICD-10-CM | POA: Diagnosis not present

## 2017-05-17 DIAGNOSIS — R278 Other lack of coordination: Secondary | ICD-10-CM | POA: Diagnosis not present

## 2017-05-17 DIAGNOSIS — M6281 Muscle weakness (generalized): Secondary | ICD-10-CM | POA: Diagnosis not present

## 2017-05-20 DIAGNOSIS — M6281 Muscle weakness (generalized): Secondary | ICD-10-CM | POA: Diagnosis not present

## 2017-05-20 DIAGNOSIS — R278 Other lack of coordination: Secondary | ICD-10-CM | POA: Diagnosis not present

## 2017-05-22 DIAGNOSIS — M6281 Muscle weakness (generalized): Secondary | ICD-10-CM | POA: Diagnosis not present

## 2017-05-22 DIAGNOSIS — R278 Other lack of coordination: Secondary | ICD-10-CM | POA: Diagnosis not present

## 2017-05-24 DIAGNOSIS — M6281 Muscle weakness (generalized): Secondary | ICD-10-CM | POA: Diagnosis not present

## 2017-05-24 DIAGNOSIS — R278 Other lack of coordination: Secondary | ICD-10-CM | POA: Diagnosis not present

## 2017-05-27 DIAGNOSIS — R278 Other lack of coordination: Secondary | ICD-10-CM | POA: Diagnosis not present

## 2017-05-27 DIAGNOSIS — M6281 Muscle weakness (generalized): Secondary | ICD-10-CM | POA: Diagnosis not present

## 2017-05-29 DIAGNOSIS — R278 Other lack of coordination: Secondary | ICD-10-CM | POA: Diagnosis not present

## 2017-05-29 DIAGNOSIS — M6281 Muscle weakness (generalized): Secondary | ICD-10-CM | POA: Diagnosis not present

## 2017-05-31 DIAGNOSIS — M6281 Muscle weakness (generalized): Secondary | ICD-10-CM | POA: Diagnosis not present

## 2017-05-31 DIAGNOSIS — R278 Other lack of coordination: Secondary | ICD-10-CM | POA: Diagnosis not present

## 2017-06-03 DIAGNOSIS — M6281 Muscle weakness (generalized): Secondary | ICD-10-CM | POA: Diagnosis not present

## 2017-06-03 DIAGNOSIS — R278 Other lack of coordination: Secondary | ICD-10-CM | POA: Diagnosis not present

## 2017-06-05 DIAGNOSIS — M6281 Muscle weakness (generalized): Secondary | ICD-10-CM | POA: Diagnosis not present

## 2017-06-05 DIAGNOSIS — R278 Other lack of coordination: Secondary | ICD-10-CM | POA: Diagnosis not present

## 2017-06-07 DIAGNOSIS — R278 Other lack of coordination: Secondary | ICD-10-CM | POA: Diagnosis not present

## 2017-06-07 DIAGNOSIS — M6281 Muscle weakness (generalized): Secondary | ICD-10-CM | POA: Diagnosis not present

## 2017-06-10 DIAGNOSIS — I10 Essential (primary) hypertension: Secondary | ICD-10-CM | POA: Diagnosis not present

## 2017-06-12 DIAGNOSIS — F329 Major depressive disorder, single episode, unspecified: Secondary | ICD-10-CM | POA: Diagnosis not present

## 2017-06-24 DIAGNOSIS — M1611 Unilateral primary osteoarthritis, right hip: Secondary | ICD-10-CM | POA: Diagnosis not present

## 2017-06-24 DIAGNOSIS — E039 Hypothyroidism, unspecified: Secondary | ICD-10-CM | POA: Diagnosis not present

## 2017-06-24 DIAGNOSIS — E119 Type 2 diabetes mellitus without complications: Secondary | ICD-10-CM | POA: Diagnosis not present

## 2017-06-24 DIAGNOSIS — R262 Difficulty in walking, not elsewhere classified: Secondary | ICD-10-CM | POA: Diagnosis not present

## 2017-06-24 DIAGNOSIS — E785 Hyperlipidemia, unspecified: Secondary | ICD-10-CM | POA: Diagnosis not present

## 2017-06-27 DIAGNOSIS — Z23 Encounter for immunization: Secondary | ICD-10-CM | POA: Diagnosis not present

## 2017-09-06 DIAGNOSIS — F329 Major depressive disorder, single episode, unspecified: Secondary | ICD-10-CM | POA: Diagnosis not present

## 2017-09-17 DIAGNOSIS — E119 Type 2 diabetes mellitus without complications: Secondary | ICD-10-CM | POA: Diagnosis not present

## 2017-09-17 DIAGNOSIS — I10 Essential (primary) hypertension: Secondary | ICD-10-CM | POA: Diagnosis not present

## 2017-10-03 DIAGNOSIS — I10 Essential (primary) hypertension: Secondary | ICD-10-CM | POA: Diagnosis not present

## 2017-10-30 DIAGNOSIS — F329 Major depressive disorder, single episode, unspecified: Secondary | ICD-10-CM | POA: Diagnosis not present

## 2017-11-22 DIAGNOSIS — G894 Chronic pain syndrome: Secondary | ICD-10-CM | POA: Diagnosis not present

## 2017-11-22 DIAGNOSIS — E039 Hypothyroidism, unspecified: Secondary | ICD-10-CM | POA: Diagnosis not present

## 2017-11-22 DIAGNOSIS — M1991 Primary osteoarthritis, unspecified site: Secondary | ICD-10-CM | POA: Diagnosis not present

## 2017-11-22 DIAGNOSIS — E785 Hyperlipidemia, unspecified: Secondary | ICD-10-CM | POA: Diagnosis not present

## 2017-12-05 DIAGNOSIS — I10 Essential (primary) hypertension: Secondary | ICD-10-CM | POA: Diagnosis not present

## 2017-12-06 DIAGNOSIS — L89153 Pressure ulcer of sacral region, stage 3: Secondary | ICD-10-CM | POA: Diagnosis not present

## 2017-12-12 DIAGNOSIS — L89150 Pressure ulcer of sacral region, unstageable: Secondary | ICD-10-CM | POA: Diagnosis not present

## 2017-12-17 DIAGNOSIS — F329 Major depressive disorder, single episode, unspecified: Secondary | ICD-10-CM | POA: Diagnosis not present

## 2017-12-19 DIAGNOSIS — L89150 Pressure ulcer of sacral region, unstageable: Secondary | ICD-10-CM | POA: Diagnosis not present

## 2017-12-26 DIAGNOSIS — L89150 Pressure ulcer of sacral region, unstageable: Secondary | ICD-10-CM | POA: Diagnosis not present

## 2018-01-08 DIAGNOSIS — L89150 Pressure ulcer of sacral region, unstageable: Secondary | ICD-10-CM | POA: Diagnosis not present

## 2018-01-14 DIAGNOSIS — I10 Essential (primary) hypertension: Secondary | ICD-10-CM | POA: Diagnosis not present

## 2018-01-14 DIAGNOSIS — R52 Pain, unspecified: Secondary | ICD-10-CM | POA: Diagnosis not present

## 2018-01-14 DIAGNOSIS — M6281 Muscle weakness (generalized): Secondary | ICD-10-CM | POA: Diagnosis not present

## 2018-01-14 DIAGNOSIS — F329 Major depressive disorder, single episode, unspecified: Secondary | ICD-10-CM | POA: Diagnosis not present

## 2018-01-20 DIAGNOSIS — R278 Other lack of coordination: Secondary | ICD-10-CM | POA: Diagnosis not present

## 2018-01-20 DIAGNOSIS — E119 Type 2 diabetes mellitus without complications: Secondary | ICD-10-CM | POA: Diagnosis not present

## 2018-01-20 DIAGNOSIS — M6281 Muscle weakness (generalized): Secondary | ICD-10-CM | POA: Diagnosis not present

## 2018-01-27 DIAGNOSIS — M6281 Muscle weakness (generalized): Secondary | ICD-10-CM | POA: Diagnosis not present

## 2018-01-27 DIAGNOSIS — E119 Type 2 diabetes mellitus without complications: Secondary | ICD-10-CM | POA: Diagnosis not present

## 2018-01-27 DIAGNOSIS — R278 Other lack of coordination: Secondary | ICD-10-CM | POA: Diagnosis not present

## 2018-02-11 DIAGNOSIS — E119 Type 2 diabetes mellitus without complications: Secondary | ICD-10-CM | POA: Diagnosis not present

## 2018-02-11 DIAGNOSIS — M6281 Muscle weakness (generalized): Secondary | ICD-10-CM | POA: Diagnosis not present

## 2018-02-11 DIAGNOSIS — R278 Other lack of coordination: Secondary | ICD-10-CM | POA: Diagnosis not present

## 2018-02-13 DIAGNOSIS — R2689 Other abnormalities of gait and mobility: Secondary | ICD-10-CM | POA: Diagnosis not present

## 2018-02-13 DIAGNOSIS — M62838 Other muscle spasm: Secondary | ICD-10-CM | POA: Diagnosis not present

## 2018-02-13 DIAGNOSIS — M25551 Pain in right hip: Secondary | ICD-10-CM | POA: Diagnosis not present

## 2018-02-13 DIAGNOSIS — G8929 Other chronic pain: Secondary | ICD-10-CM | POA: Diagnosis not present

## 2018-02-13 DIAGNOSIS — M1611 Unilateral primary osteoarthritis, right hip: Secondary | ICD-10-CM | POA: Diagnosis not present

## 2018-02-18 DIAGNOSIS — F329 Major depressive disorder, single episode, unspecified: Secondary | ICD-10-CM | POA: Diagnosis not present

## 2018-02-18 DIAGNOSIS — M6281 Muscle weakness (generalized): Secondary | ICD-10-CM | POA: Diagnosis not present

## 2018-02-18 DIAGNOSIS — R278 Other lack of coordination: Secondary | ICD-10-CM | POA: Diagnosis not present

## 2018-02-18 DIAGNOSIS — E119 Type 2 diabetes mellitus without complications: Secondary | ICD-10-CM | POA: Diagnosis not present

## 2018-02-19 DIAGNOSIS — R278 Other lack of coordination: Secondary | ICD-10-CM | POA: Diagnosis not present

## 2018-02-19 DIAGNOSIS — M6281 Muscle weakness (generalized): Secondary | ICD-10-CM | POA: Diagnosis not present

## 2018-02-19 DIAGNOSIS — E119 Type 2 diabetes mellitus without complications: Secondary | ICD-10-CM | POA: Diagnosis not present

## 2018-02-24 DIAGNOSIS — R278 Other lack of coordination: Secondary | ICD-10-CM | POA: Diagnosis not present

## 2018-02-24 DIAGNOSIS — M6281 Muscle weakness (generalized): Secondary | ICD-10-CM | POA: Diagnosis not present

## 2018-02-24 DIAGNOSIS — E119 Type 2 diabetes mellitus without complications: Secondary | ICD-10-CM | POA: Diagnosis not present

## 2018-02-25 DIAGNOSIS — I10 Essential (primary) hypertension: Secondary | ICD-10-CM | POA: Diagnosis not present

## 2018-02-25 DIAGNOSIS — R309 Painful micturition, unspecified: Secondary | ICD-10-CM | POA: Diagnosis not present

## 2018-02-28 DIAGNOSIS — E119 Type 2 diabetes mellitus without complications: Secondary | ICD-10-CM | POA: Diagnosis not present

## 2018-02-28 DIAGNOSIS — M6281 Muscle weakness (generalized): Secondary | ICD-10-CM | POA: Diagnosis not present

## 2018-02-28 DIAGNOSIS — R278 Other lack of coordination: Secondary | ICD-10-CM | POA: Diagnosis not present

## 2018-03-03 DIAGNOSIS — E119 Type 2 diabetes mellitus without complications: Secondary | ICD-10-CM | POA: Diagnosis not present

## 2018-03-03 DIAGNOSIS — R278 Other lack of coordination: Secondary | ICD-10-CM | POA: Diagnosis not present

## 2018-03-03 DIAGNOSIS — M6281 Muscle weakness (generalized): Secondary | ICD-10-CM | POA: Diagnosis not present

## 2018-03-05 DIAGNOSIS — R278 Other lack of coordination: Secondary | ICD-10-CM | POA: Diagnosis not present

## 2018-03-05 DIAGNOSIS — M6281 Muscle weakness (generalized): Secondary | ICD-10-CM | POA: Diagnosis not present

## 2018-03-05 DIAGNOSIS — E119 Type 2 diabetes mellitus without complications: Secondary | ICD-10-CM | POA: Diagnosis not present

## 2018-03-07 DIAGNOSIS — E119 Type 2 diabetes mellitus without complications: Secondary | ICD-10-CM | POA: Diagnosis not present

## 2018-03-07 DIAGNOSIS — R278 Other lack of coordination: Secondary | ICD-10-CM | POA: Diagnosis not present

## 2018-03-07 DIAGNOSIS — M6281 Muscle weakness (generalized): Secondary | ICD-10-CM | POA: Diagnosis not present

## 2018-03-10 DIAGNOSIS — R278 Other lack of coordination: Secondary | ICD-10-CM | POA: Diagnosis not present

## 2018-03-10 DIAGNOSIS — R2689 Other abnormalities of gait and mobility: Secondary | ICD-10-CM | POA: Diagnosis not present

## 2018-03-10 DIAGNOSIS — E119 Type 2 diabetes mellitus without complications: Secondary | ICD-10-CM | POA: Diagnosis not present

## 2018-03-10 DIAGNOSIS — M6281 Muscle weakness (generalized): Secondary | ICD-10-CM | POA: Diagnosis not present

## 2018-03-12 DIAGNOSIS — M6281 Muscle weakness (generalized): Secondary | ICD-10-CM | POA: Diagnosis not present

## 2018-03-12 DIAGNOSIS — R278 Other lack of coordination: Secondary | ICD-10-CM | POA: Diagnosis not present

## 2018-03-12 DIAGNOSIS — R2689 Other abnormalities of gait and mobility: Secondary | ICD-10-CM | POA: Diagnosis not present

## 2018-03-12 DIAGNOSIS — E119 Type 2 diabetes mellitus without complications: Secondary | ICD-10-CM | POA: Diagnosis not present

## 2018-03-14 DIAGNOSIS — E119 Type 2 diabetes mellitus without complications: Secondary | ICD-10-CM | POA: Diagnosis not present

## 2018-03-14 DIAGNOSIS — R2689 Other abnormalities of gait and mobility: Secondary | ICD-10-CM | POA: Diagnosis not present

## 2018-03-14 DIAGNOSIS — M6281 Muscle weakness (generalized): Secondary | ICD-10-CM | POA: Diagnosis not present

## 2018-03-14 DIAGNOSIS — R278 Other lack of coordination: Secondary | ICD-10-CM | POA: Diagnosis not present

## 2018-03-17 DIAGNOSIS — R2689 Other abnormalities of gait and mobility: Secondary | ICD-10-CM | POA: Diagnosis not present

## 2018-03-17 DIAGNOSIS — E119 Type 2 diabetes mellitus without complications: Secondary | ICD-10-CM | POA: Diagnosis not present

## 2018-03-17 DIAGNOSIS — R278 Other lack of coordination: Secondary | ICD-10-CM | POA: Diagnosis not present

## 2018-03-17 DIAGNOSIS — M6281 Muscle weakness (generalized): Secondary | ICD-10-CM | POA: Diagnosis not present

## 2018-03-18 DIAGNOSIS — E119 Type 2 diabetes mellitus without complications: Secondary | ICD-10-CM | POA: Diagnosis not present

## 2018-03-18 DIAGNOSIS — I10 Essential (primary) hypertension: Secondary | ICD-10-CM | POA: Diagnosis not present

## 2018-03-19 DIAGNOSIS — R278 Other lack of coordination: Secondary | ICD-10-CM | POA: Diagnosis not present

## 2018-03-19 DIAGNOSIS — R2689 Other abnormalities of gait and mobility: Secondary | ICD-10-CM | POA: Diagnosis not present

## 2018-03-19 DIAGNOSIS — E119 Type 2 diabetes mellitus without complications: Secondary | ICD-10-CM | POA: Diagnosis not present

## 2018-03-19 DIAGNOSIS — M6281 Muscle weakness (generalized): Secondary | ICD-10-CM | POA: Diagnosis not present

## 2018-03-21 DIAGNOSIS — R2689 Other abnormalities of gait and mobility: Secondary | ICD-10-CM | POA: Diagnosis not present

## 2018-03-21 DIAGNOSIS — R278 Other lack of coordination: Secondary | ICD-10-CM | POA: Diagnosis not present

## 2018-03-21 DIAGNOSIS — E119 Type 2 diabetes mellitus without complications: Secondary | ICD-10-CM | POA: Diagnosis not present

## 2018-03-21 DIAGNOSIS — M6281 Muscle weakness (generalized): Secondary | ICD-10-CM | POA: Diagnosis not present

## 2018-03-24 DIAGNOSIS — R2689 Other abnormalities of gait and mobility: Secondary | ICD-10-CM | POA: Diagnosis not present

## 2018-03-24 DIAGNOSIS — M6281 Muscle weakness (generalized): Secondary | ICD-10-CM | POA: Diagnosis not present

## 2018-03-24 DIAGNOSIS — R278 Other lack of coordination: Secondary | ICD-10-CM | POA: Diagnosis not present

## 2018-03-24 DIAGNOSIS — E119 Type 2 diabetes mellitus without complications: Secondary | ICD-10-CM | POA: Diagnosis not present

## 2018-03-26 DIAGNOSIS — M6281 Muscle weakness (generalized): Secondary | ICD-10-CM | POA: Diagnosis not present

## 2018-03-26 DIAGNOSIS — E119 Type 2 diabetes mellitus without complications: Secondary | ICD-10-CM | POA: Diagnosis not present

## 2018-03-26 DIAGNOSIS — R2689 Other abnormalities of gait and mobility: Secondary | ICD-10-CM | POA: Diagnosis not present

## 2018-03-26 DIAGNOSIS — R278 Other lack of coordination: Secondary | ICD-10-CM | POA: Diagnosis not present

## 2018-03-28 DIAGNOSIS — R278 Other lack of coordination: Secondary | ICD-10-CM | POA: Diagnosis not present

## 2018-03-28 DIAGNOSIS — E119 Type 2 diabetes mellitus without complications: Secondary | ICD-10-CM | POA: Diagnosis not present

## 2018-03-28 DIAGNOSIS — R2689 Other abnormalities of gait and mobility: Secondary | ICD-10-CM | POA: Diagnosis not present

## 2018-03-28 DIAGNOSIS — M6281 Muscle weakness (generalized): Secondary | ICD-10-CM | POA: Diagnosis not present

## 2018-03-31 DIAGNOSIS — M6281 Muscle weakness (generalized): Secondary | ICD-10-CM | POA: Diagnosis not present

## 2018-03-31 DIAGNOSIS — R278 Other lack of coordination: Secondary | ICD-10-CM | POA: Diagnosis not present

## 2018-03-31 DIAGNOSIS — E119 Type 2 diabetes mellitus without complications: Secondary | ICD-10-CM | POA: Diagnosis not present

## 2018-03-31 DIAGNOSIS — R2689 Other abnormalities of gait and mobility: Secondary | ICD-10-CM | POA: Diagnosis not present

## 2018-04-02 DIAGNOSIS — R278 Other lack of coordination: Secondary | ICD-10-CM | POA: Diagnosis not present

## 2018-04-02 DIAGNOSIS — R2689 Other abnormalities of gait and mobility: Secondary | ICD-10-CM | POA: Diagnosis not present

## 2018-04-02 DIAGNOSIS — E119 Type 2 diabetes mellitus without complications: Secondary | ICD-10-CM | POA: Diagnosis not present

## 2018-04-02 DIAGNOSIS — M6281 Muscle weakness (generalized): Secondary | ICD-10-CM | POA: Diagnosis not present

## 2018-04-04 DIAGNOSIS — E119 Type 2 diabetes mellitus without complications: Secondary | ICD-10-CM | POA: Diagnosis not present

## 2018-04-04 DIAGNOSIS — M6281 Muscle weakness (generalized): Secondary | ICD-10-CM | POA: Diagnosis not present

## 2018-04-04 DIAGNOSIS — R278 Other lack of coordination: Secondary | ICD-10-CM | POA: Diagnosis not present

## 2018-04-04 DIAGNOSIS — R2689 Other abnormalities of gait and mobility: Secondary | ICD-10-CM | POA: Diagnosis not present

## 2018-04-07 DIAGNOSIS — E119 Type 2 diabetes mellitus without complications: Secondary | ICD-10-CM | POA: Diagnosis not present

## 2018-04-07 DIAGNOSIS — M6281 Muscle weakness (generalized): Secondary | ICD-10-CM | POA: Diagnosis not present

## 2018-04-07 DIAGNOSIS — R2689 Other abnormalities of gait and mobility: Secondary | ICD-10-CM | POA: Diagnosis not present

## 2018-04-07 DIAGNOSIS — R278 Other lack of coordination: Secondary | ICD-10-CM | POA: Diagnosis not present

## 2018-04-09 DIAGNOSIS — E119 Type 2 diabetes mellitus without complications: Secondary | ICD-10-CM | POA: Diagnosis not present

## 2018-04-09 DIAGNOSIS — R278 Other lack of coordination: Secondary | ICD-10-CM | POA: Diagnosis not present

## 2018-04-09 DIAGNOSIS — M6281 Muscle weakness (generalized): Secondary | ICD-10-CM | POA: Diagnosis not present

## 2018-04-09 DIAGNOSIS — R2689 Other abnormalities of gait and mobility: Secondary | ICD-10-CM | POA: Diagnosis not present

## 2018-04-11 DIAGNOSIS — M6281 Muscle weakness (generalized): Secondary | ICD-10-CM | POA: Diagnosis not present

## 2018-04-11 DIAGNOSIS — R278 Other lack of coordination: Secondary | ICD-10-CM | POA: Diagnosis not present

## 2018-04-11 DIAGNOSIS — E119 Type 2 diabetes mellitus without complications: Secondary | ICD-10-CM | POA: Diagnosis not present

## 2018-04-11 DIAGNOSIS — R2689 Other abnormalities of gait and mobility: Secondary | ICD-10-CM | POA: Diagnosis not present

## 2018-04-14 DIAGNOSIS — R2689 Other abnormalities of gait and mobility: Secondary | ICD-10-CM | POA: Diagnosis not present

## 2018-04-14 DIAGNOSIS — R278 Other lack of coordination: Secondary | ICD-10-CM | POA: Diagnosis not present

## 2018-04-14 DIAGNOSIS — M6281 Muscle weakness (generalized): Secondary | ICD-10-CM | POA: Diagnosis not present

## 2018-04-14 DIAGNOSIS — E119 Type 2 diabetes mellitus without complications: Secondary | ICD-10-CM | POA: Diagnosis not present

## 2018-04-15 DIAGNOSIS — F329 Major depressive disorder, single episode, unspecified: Secondary | ICD-10-CM | POA: Diagnosis not present

## 2018-04-16 DIAGNOSIS — M6281 Muscle weakness (generalized): Secondary | ICD-10-CM | POA: Diagnosis not present

## 2018-04-16 DIAGNOSIS — R278 Other lack of coordination: Secondary | ICD-10-CM | POA: Diagnosis not present

## 2018-04-16 DIAGNOSIS — R2689 Other abnormalities of gait and mobility: Secondary | ICD-10-CM | POA: Diagnosis not present

## 2018-04-16 DIAGNOSIS — E119 Type 2 diabetes mellitus without complications: Secondary | ICD-10-CM | POA: Diagnosis not present

## 2018-04-18 DIAGNOSIS — M6281 Muscle weakness (generalized): Secondary | ICD-10-CM | POA: Diagnosis not present

## 2018-04-18 DIAGNOSIS — R2689 Other abnormalities of gait and mobility: Secondary | ICD-10-CM | POA: Diagnosis not present

## 2018-04-18 DIAGNOSIS — R278 Other lack of coordination: Secondary | ICD-10-CM | POA: Diagnosis not present

## 2018-04-18 DIAGNOSIS — E119 Type 2 diabetes mellitus without complications: Secondary | ICD-10-CM | POA: Diagnosis not present

## 2018-04-21 DIAGNOSIS — M1611 Unilateral primary osteoarthritis, right hip: Secondary | ICD-10-CM | POA: Diagnosis not present

## 2018-04-21 DIAGNOSIS — R339 Retention of urine, unspecified: Secondary | ICD-10-CM | POA: Diagnosis not present

## 2018-04-21 DIAGNOSIS — E785 Hyperlipidemia, unspecified: Secondary | ICD-10-CM | POA: Diagnosis not present

## 2018-04-21 DIAGNOSIS — E118 Type 2 diabetes mellitus with unspecified complications: Secondary | ICD-10-CM | POA: Diagnosis not present

## 2018-04-21 DIAGNOSIS — I10 Essential (primary) hypertension: Secondary | ICD-10-CM | POA: Diagnosis not present

## 2018-04-25 DIAGNOSIS — M1611 Unilateral primary osteoarthritis, right hip: Secondary | ICD-10-CM | POA: Diagnosis not present

## 2018-05-15 DIAGNOSIS — T83511A Infection and inflammatory reaction due to indwelling urethral catheter, initial encounter: Secondary | ICD-10-CM | POA: Diagnosis not present

## 2018-05-15 DIAGNOSIS — R339 Retention of urine, unspecified: Secondary | ICD-10-CM | POA: Diagnosis not present

## 2018-05-15 DIAGNOSIS — R8290 Unspecified abnormal findings in urine: Secondary | ICD-10-CM | POA: Diagnosis not present

## 2018-06-14 DIAGNOSIS — I10 Essential (primary) hypertension: Secondary | ICD-10-CM | POA: Diagnosis not present

## 2018-06-14 DIAGNOSIS — R062 Wheezing: Secondary | ICD-10-CM | POA: Diagnosis not present

## 2018-06-14 DIAGNOSIS — R0602 Shortness of breath: Secondary | ICD-10-CM | POA: Diagnosis not present

## 2018-06-14 DIAGNOSIS — R0902 Hypoxemia: Secondary | ICD-10-CM | POA: Diagnosis not present

## 2018-06-16 DIAGNOSIS — I10 Essential (primary) hypertension: Secondary | ICD-10-CM | POA: Diagnosis not present

## 2018-06-17 DIAGNOSIS — F329 Major depressive disorder, single episode, unspecified: Secondary | ICD-10-CM | POA: Diagnosis not present

## 2018-06-17 DIAGNOSIS — R05 Cough: Secondary | ICD-10-CM | POA: Diagnosis not present

## 2018-06-17 DIAGNOSIS — R509 Fever, unspecified: Secondary | ICD-10-CM | POA: Diagnosis not present

## 2018-06-17 DIAGNOSIS — R52 Pain, unspecified: Secondary | ICD-10-CM | POA: Diagnosis not present

## 2018-07-01 ENCOUNTER — Encounter (HOSPITAL_COMMUNITY): Payer: Self-pay | Admitting: Student

## 2018-07-01 NOTE — H&P (Signed)
TOTAL HIP ADMISSION H&P  Patient is admitted for right total hip arthroplasty - posterior approach.  Subjective:  Chief Complaint: right hip pain  HPI: Phillip Grant, 77 y.o. male, has a history of pain and functional disability in the right hip(s) due to arthritis and patient has failed non-surgical conservative treatments for greater than 12 weeks to include corticosteriod injections, use of assistive devices and activity modification.  Onset of symptoms was gradual starting >10 years ago with gradually worsening course since that time.The patient noted no past surgery on the right hip(s).  Patient currently rates pain in the right hip at 10 out of 10 with activity. Patient has worsening of pain with activity and weight bearing and instability. Patient has evidence of severe end stage erosive arthritis of his right hip with acetabular erosion and erosion of a portion of his femoral head by imaging studies. This condition presents safety issues increasing the risk of falls. There is no current active infection.  Patient Active Problem List   Diagnosis Date Noted  . DM 06/16/2008  . HYPERLIPIDEMIA 06/16/2008  . OBSTRUCTIVE SLEEP APNEA 06/16/2008   History reviewed. No pertinent past medical history.  History reviewed. No pertinent surgical history.  No current facility-administered medications for this encounter.    No current outpatient medications on file.   Allergies not on file  Social History   Tobacco Use  . Smoking status: Not on file  Substance Use Topics  . Alcohol use: Not on file    History reviewed. No pertinent family history.   Review of Systems  Constitutional: Negative for chills and fever.  HENT: Negative for congestion, sore throat and tinnitus.   Eyes: Negative for double vision, photophobia and pain.  Respiratory: Negative for cough, shortness of breath and wheezing.   Cardiovascular: Negative for chest pain, palpitations and orthopnea.  Gastrointestinal:  Negative for heartburn, nausea and vomiting.  Genitourinary: Negative for dysuria, frequency and urgency.  Musculoskeletal: Positive for joint pain.  Neurological: Negative for dizziness, weakness and headaches.    Objective:  Physical Exam  Well nourished and well developed. General: Alert and oriented x3, cooperative and pleasant, no acute distress. Head: normocephalic, atraumatic, neck supple. Eyes: EOMI. Respiratory: breath sounds clear in all fields, no wheezing, rales, or rhonchi. Cardiovascular: Regular rate and rhythm, no murmurs, gallops or rubs.  Abdomen: non-tender to palpation and soft, normoactive bowel sounds. Musculoskeletal: Pt is sitting in a wheelchair. He has normal range of motion of his left hip without discomfort.  His right hip can be flexed 90 degrees with no internal or external rotation and no abduction. Right knee shows no effusion. Range of motion is 0-135. There is no crepitus on range of motion of the knee. There is no medial or lateral joint line tenderness. There is no instability noted. Calves soft and nontender. Motor function intact in LE. Strength 5/5 LE bilaterally. Neuro: Distal pulses 2+. Sensation to light touch intact in LE.  Vital signs in last 24 hours: Blood pressure: 146/70 mmHg Pulse: 92 bpm   Labs:   There is no height or weight on file to calculate BMI.   Imaging Review Plain radiographs demonstrate severe degenerative joint disease of the right hip(s). The bone quality appears to be adequate for age and reported activity level.    Preoperative templating of the joint replacement has been completed, documented, and submitted to the Operating Room personnel in order to optimize intra-operative equipment management.     Assessment/Plan:  End stage arthritis,  right hip(s)  The patient history, physical examination, clinical judgement of the provider and imaging studies are consistent with end stage degenerative joint disease  of the right hip(s) and total hip arthroplasty is deemed medically necessary. The treatment options including medical management, injection therapy, arthroscopy and arthroplasty were discussed at length. The risks and benefits of total hip arthroplasty were presented and reviewed. The risks due to aseptic loosening, infection, stiffness, dislocation/subluxation,  thromboembolic complications and other imponderables were discussed.  The patient acknowledged the explanation, agreed to proceed with the plan and consent was signed. Patient is being admitted for inpatient treatment for surgery, pain control, PT, OT, prophylactic antibiotics, VTE prophylaxis, progressive ambulation and ADL's and discharge planning.The patient is planning to be discharged to skilled nursing facility.  Therapy Plans: SNF ([facility]) Disposition: SNF Planned DVT Prophylaxis: Aspirin 325 mg BID DME needed: None PCP: Dr. Leroy Kennedy TXA: IV Allergies: NKDA Anesthesia Concerns: None Last HgbA1c: 5.4% Other: Post-operative pain management will be complicated by chronic opioid use.  - Patient was instructed on what medications to stop prior to surgery. - Follow-up visit in 2 weeks with Dr. Lequita Halt - Begin physical therapy following surgery - Pre-operative lab work as pre-surgical testing - Prescriptions will be provided in hospital at time of discharge  Arther Abbott, PA-C Orthopedic Surgery [facility] [place]

## 2018-07-08 DIAGNOSIS — F339 Major depressive disorder, recurrent, unspecified: Secondary | ICD-10-CM | POA: Diagnosis not present

## 2018-07-09 DIAGNOSIS — R52 Pain, unspecified: Secondary | ICD-10-CM | POA: Diagnosis not present

## 2018-07-09 DIAGNOSIS — E089 Diabetes mellitus due to underlying condition without complications: Secondary | ICD-10-CM | POA: Diagnosis not present

## 2018-07-09 DIAGNOSIS — I10 Essential (primary) hypertension: Secondary | ICD-10-CM | POA: Diagnosis not present

## 2018-07-09 DIAGNOSIS — F329 Major depressive disorder, single episode, unspecified: Secondary | ICD-10-CM | POA: Diagnosis not present

## 2018-07-14 ENCOUNTER — Encounter (HOSPITAL_COMMUNITY): Payer: Self-pay | Admitting: *Deleted

## 2018-07-14 NOTE — Progress Notes (Signed)
Preop instructions for:  Phillip Grant                        Date of Birth : Jan 03, 1941                           Date of Procedure:07/23/2018        Doctor: DR Ollen Gross  Time to arrive at [facility]:  [id number] Report to: Admitting  Procedure: Right Total Hip Arthroplasty  Any procedure time changes, MD office will notify you!   Do not eat or drink past midnight the night before your procedure.(To include any tube feedings-must be discontinued)     Take these morning medications only with sips of water.(or give through gastrostomy or feeding tube). Amlodipine ( Norvasc), Cymbalta, Synthroid,  Note: No Insulin or Diabetic meds should be given or taken the morning of the procedure! Please send colostomy supplies with patient.   Facility contact: [facility] at  [facility]                  Phone:[phone]                  Health Care POA:  Transportation contact phone#: [facility] at [place]  Please send day of procedure:current med list and meds last taken that day, confirm nothing by mouth status from what time, Patient Demographic info( to include DNR status, problem list, allergies)   RN contact name/phone#:   Steward Drone- [phone]                          and Fax #: [phone] Bring Insurance card and picture ID Leave all jewelry and other valuables at place where living( no metal or rings to be worn) No contact lens , no lotions,perfumes,powders Men-no colognes,lotions, deodorant   Any questions day of procedure,call Short Stay [phone]   Sent from :[facility]                   Phone:[phone]                   Fax:[phone]  Sent by :Cyndia Diver RN

## 2018-07-15 NOTE — Progress Notes (Signed)
LM on VM for Phillip Grant, scheduler for Dr. Lequita Halt to call me back as Nursing Home can obtain lab work for patient's surgery on 07/23/2018 and I need for her to send an order over to them for what labs need to be drawn.

## 2018-07-15 NOTE — Progress Notes (Signed)
Received a call from [facility] at [facility] informing me that they can draw patient lab work there for patient's surgery on 07/23/2018.

## 2018-07-22 DIAGNOSIS — F339 Major depressive disorder, recurrent, unspecified: Secondary | ICD-10-CM | POA: Diagnosis not present

## 2018-07-22 MED ORDER — DEXTROSE 5 % IV SOLN
3.0000 g | INTRAVENOUS | Status: AC
  Administered 2018-07-23: 3 g via INTRAVENOUS
  Filled 2018-07-22: qty 3

## 2018-07-22 NOTE — Progress Notes (Signed)
Received and placed on chart the following labs from [facility]:  CMP, PT,PTT and CBC/DIff done 07/16/2018.

## 2018-07-22 NOTE — Progress Notes (Signed)
[facility] for [facility] - patient's wife with date fo surgery, arrival time and time of surgery.  Asked for call back.

## 2018-07-22 NOTE — Anesthesia Preprocedure Evaluation (Addendum)
Anesthesia Evaluation  Patient identified by MRN, date of birth, ID band Patient awake    Reviewed: Allergy & Precautions, NPO status , Patient's Chart, lab work & pertinent test results  Airway Mallampati: II  TM Distance: >3 FB Neck ROM: Full    Dental no notable dental hx. (+) Upper Dentures, Loose,    Pulmonary sleep apnea , former smoker,    Pulmonary exam normal breath sounds clear to auscultation       Cardiovascular hypertension, Pt. on medications Normal cardiovascular exam Rhythm:Regular Rate:Normal     Neuro/Psych Anxiety negative neurological ROS     GI/Hepatic negative GI ROS, Neg liver ROS,   Endo/Other  diabetes, Type 2Hypothyroidism   Renal/GU negative Renal ROS     Musculoskeletal  (+) Arthritis ,   Abdominal   Peds  Hematology negative hematology ROS (+)   Anesthesia Other Findings   Reproductive/Obstetrics                           Anesthesia Physical Anesthesia Plan  ASA: III  Anesthesia Plan: General   Post-op Pain Management:  Regional for Post-op pain   Induction: Intravenous  PONV Risk Score and Plan: 2 and Treatment may vary due to age or medical condition, Ondansetron and Dexamethasone  Airway Management Planned: Oral ETT  Additional Equipment:   Intra-op Plan:   Post-operative Plan: Extubation in OR  Informed Consent: I have reviewed the patients History and Physical, chart, labs and discussed the procedure including the risks, benefits and alternatives for the proposed anesthesia with the patient or authorized representative who has indicated his/her understanding and acceptance.   Dental advisory given  Plan Discussed with: CRNA  Anesthesia Plan Comments:        Anesthesia Quick Evaluation

## 2018-07-23 ENCOUNTER — Encounter (HOSPITAL_COMMUNITY)
Admission: RE | Disposition: A | Discharge status: Skilled Nursing Facility | Payer: Self-pay | Source: Home / Self Care | Attending: Orthopedic Surgery

## 2018-07-23 ENCOUNTER — Inpatient Hospital Stay (HOSPITAL_COMMUNITY): Payer: Medicare Other | Admitting: Anesthesiology

## 2018-07-23 ENCOUNTER — Other Ambulatory Visit: Payer: Self-pay

## 2018-07-23 ENCOUNTER — Inpatient Hospital Stay (HOSPITAL_COMMUNITY): Payer: Medicare Other

## 2018-07-23 ENCOUNTER — Encounter (HOSPITAL_COMMUNITY): Payer: Self-pay | Admitting: *Deleted

## 2018-07-23 ENCOUNTER — Inpatient Hospital Stay (HOSPITAL_COMMUNITY)
Admission: RE | Admit: 2018-07-23 | Discharge: 2018-07-25 | DRG: 470 | Disposition: A | Discharge status: Skilled Nursing Facility | Payer: Medicare Other | Attending: Orthopedic Surgery | Admitting: Orthopedic Surgery

## 2018-07-23 DIAGNOSIS — N319 Neuromuscular dysfunction of bladder, unspecified: Secondary | ICD-10-CM | POA: Diagnosis present

## 2018-07-23 DIAGNOSIS — N183 Chronic kidney disease, stage 3 (moderate): Secondary | ICD-10-CM | POA: Diagnosis present

## 2018-07-23 DIAGNOSIS — E119 Type 2 diabetes mellitus without complications: Secondary | ICD-10-CM | POA: Diagnosis not present

## 2018-07-23 DIAGNOSIS — Z87891 Personal history of nicotine dependence: Secondary | ICD-10-CM

## 2018-07-23 DIAGNOSIS — Z79890 Hormone replacement therapy (postmenopausal): Secondary | ICD-10-CM | POA: Diagnosis not present

## 2018-07-23 DIAGNOSIS — E785 Hyperlipidemia, unspecified: Secondary | ICD-10-CM | POA: Diagnosis present

## 2018-07-23 DIAGNOSIS — M1611 Unilateral primary osteoarthritis, right hip: Principal | ICD-10-CM | POA: Diagnosis present

## 2018-07-23 DIAGNOSIS — D62 Acute posthemorrhagic anemia: Secondary | ICD-10-CM | POA: Diagnosis not present

## 2018-07-23 DIAGNOSIS — R627 Adult failure to thrive: Secondary | ICD-10-CM | POA: Diagnosis not present

## 2018-07-23 DIAGNOSIS — M2550 Pain in unspecified joint: Secondary | ICD-10-CM | POA: Diagnosis not present

## 2018-07-23 DIAGNOSIS — E1122 Type 2 diabetes mellitus with diabetic chronic kidney disease: Secondary | ICD-10-CM | POA: Diagnosis present

## 2018-07-23 DIAGNOSIS — Z9181 History of falling: Secondary | ICD-10-CM

## 2018-07-23 DIAGNOSIS — Z419 Encounter for procedure for purposes other than remedying health state, unspecified: Secondary | ICD-10-CM

## 2018-07-23 DIAGNOSIS — I129 Hypertensive chronic kidney disease with stage 1 through stage 4 chronic kidney disease, or unspecified chronic kidney disease: Secondary | ICD-10-CM | POA: Diagnosis present

## 2018-07-23 DIAGNOSIS — K219 Gastro-esophageal reflux disease without esophagitis: Secondary | ICD-10-CM | POA: Diagnosis present

## 2018-07-23 DIAGNOSIS — Z7984 Long term (current) use of oral hypoglycemic drugs: Secondary | ICD-10-CM

## 2018-07-23 DIAGNOSIS — Z96649 Presence of unspecified artificial hip joint: Secondary | ICD-10-CM

## 2018-07-23 DIAGNOSIS — M169 Osteoarthritis of hip, unspecified: Secondary | ICD-10-CM | POA: Diagnosis present

## 2018-07-23 DIAGNOSIS — M1990 Unspecified osteoarthritis, unspecified site: Secondary | ICD-10-CM | POA: Diagnosis not present

## 2018-07-23 DIAGNOSIS — I959 Hypotension, unspecified: Secondary | ICD-10-CM | POA: Diagnosis not present

## 2018-07-23 DIAGNOSIS — R339 Retention of urine, unspecified: Secondary | ICD-10-CM | POA: Diagnosis not present

## 2018-07-23 DIAGNOSIS — G629 Polyneuropathy, unspecified: Secondary | ICD-10-CM | POA: Diagnosis not present

## 2018-07-23 DIAGNOSIS — G4700 Insomnia, unspecified: Secondary | ICD-10-CM | POA: Diagnosis not present

## 2018-07-23 DIAGNOSIS — G4733 Obstructive sleep apnea (adult) (pediatric): Secondary | ICD-10-CM | POA: Diagnosis not present

## 2018-07-23 DIAGNOSIS — M25551 Pain in right hip: Secondary | ICD-10-CM | POA: Diagnosis not present

## 2018-07-23 DIAGNOSIS — E039 Hypothyroidism, unspecified: Secondary | ICD-10-CM | POA: Diagnosis present

## 2018-07-23 DIAGNOSIS — Z433 Encounter for attention to colostomy: Secondary | ICD-10-CM | POA: Diagnosis not present

## 2018-07-23 DIAGNOSIS — Z436 Encounter for attention to other artificial openings of urinary tract: Secondary | ICD-10-CM | POA: Diagnosis not present

## 2018-07-23 DIAGNOSIS — Z79899 Other long term (current) drug therapy: Secondary | ICD-10-CM

## 2018-07-23 DIAGNOSIS — I10 Essential (primary) hypertension: Secondary | ICD-10-CM | POA: Diagnosis not present

## 2018-07-23 DIAGNOSIS — G8929 Other chronic pain: Secondary | ICD-10-CM | POA: Diagnosis not present

## 2018-07-23 DIAGNOSIS — F329 Major depressive disorder, single episode, unspecified: Secondary | ICD-10-CM | POA: Diagnosis present

## 2018-07-23 DIAGNOSIS — Z471 Aftercare following joint replacement surgery: Secondary | ICD-10-CM | POA: Diagnosis not present

## 2018-07-23 DIAGNOSIS — Z96641 Presence of right artificial hip joint: Secondary | ICD-10-CM | POA: Diagnosis not present

## 2018-07-23 DIAGNOSIS — Z7401 Bed confinement status: Secondary | ICD-10-CM | POA: Diagnosis not present

## 2018-07-23 HISTORY — DX: Type 2 diabetes mellitus without complications: E11.9

## 2018-07-23 HISTORY — DX: Essential (primary) hypertension: I10

## 2018-07-23 HISTORY — DX: Major depressive disorder, single episode, unspecified: F32.9

## 2018-07-23 HISTORY — DX: Depression, unspecified: F32.A

## 2018-07-23 HISTORY — DX: Hyperlipidemia, unspecified: E78.5

## 2018-07-23 HISTORY — DX: Unspecified osteoarthritis, unspecified site: M19.90

## 2018-07-23 HISTORY — DX: Gastro-esophageal reflux disease without esophagitis: K21.9

## 2018-07-23 HISTORY — DX: Neuromuscular dysfunction of bladder, unspecified: N31.9

## 2018-07-23 HISTORY — PX: TOTAL HIP ARTHROPLASTY: SHX124

## 2018-07-23 LAB — COMPREHENSIVE METABOLIC PANEL
ALT: 15 U/L (ref 0–44)
AST: 17 U/L (ref 15–41)
Albumin: 3.6 g/dL (ref 3.5–5.0)
Alkaline Phosphatase: 153 U/L — ABNORMAL HIGH (ref 38–126)
Anion gap: 8 (ref 5–15)
BUN: 41 mg/dL — AB (ref 8–23)
CO2: 25 mmol/L (ref 22–32)
CREATININE: 1.5 mg/dL — AB (ref 0.61–1.24)
Calcium: 8.7 mg/dL — ABNORMAL LOW (ref 8.9–10.3)
Chloride: 103 mmol/L (ref 98–111)
GFR calc Af Amer: 50 mL/min — ABNORMAL LOW (ref >60)
GFR calc non Af Amer: 43 mL/min — ABNORMAL LOW (ref >60)
Glucose, Bld: 144 mg/dL — ABNORMAL HIGH (ref 70–99)
Potassium: 4.8 mmol/L (ref 3.5–5.1)
Sodium: 136 mmol/L (ref 135–145)
Total Bilirubin: 0.6 mg/dL (ref 0.3–1.2)
Total Protein: 8.4 g/dL — ABNORMAL HIGH (ref 6.5–8.1)

## 2018-07-23 LAB — GLUCOSE, CAPILLARY
GLUCOSE-CAPILLARY: 128 mg/dL — AB (ref 70–99)
Glucose-Capillary: 169 mg/dL — ABNORMAL HIGH (ref 70–99)
Glucose-Capillary: 174 mg/dL — ABNORMAL HIGH (ref 70–99)

## 2018-07-23 LAB — CBC
HEMATOCRIT: 45.5 % (ref 39.0–52.0)
HEMOGLOBIN: 13.7 g/dL (ref 13.0–17.0)
MCH: 26.2 pg (ref 26.0–34.0)
MCHC: 30.1 g/dL (ref 30.0–36.0)
MCV: 87 fL (ref 80.0–100.0)
NRBC: 0 % (ref 0.0–0.2)
PLATELETS: 300 10*3/uL (ref 150–400)
RBC: 5.23 MIL/uL (ref 4.22–5.81)
RDW: 14.9 % (ref 11.5–15.5)
WBC: 11.1 10*3/uL — AB (ref 4.0–10.5)

## 2018-07-23 LAB — ABO/RH: ABO/RH(D): A POS

## 2018-07-23 LAB — SURGICAL PCR SCREEN
MRSA, PCR: NEGATIVE
Staphylococcus aureus: POSITIVE — AB

## 2018-07-23 LAB — POCT I-STAT 4, (NA,K, GLUC, HGB,HCT)
GLUCOSE: 167 mg/dL — AB (ref 70–99)
HEMATOCRIT: 32 % — AB (ref 39.0–52.0)
Hemoglobin: 10.9 g/dL — ABNORMAL LOW (ref 13.0–17.0)
Potassium: 5.1 mmol/L (ref 3.5–5.1)
Sodium: 138 mmol/L (ref 135–145)

## 2018-07-23 LAB — PROTIME-INR
INR: 0.98
Prothrombin Time: 12.9 seconds (ref 11.4–15.2)

## 2018-07-23 LAB — PREPARE RBC (CROSSMATCH)

## 2018-07-23 LAB — APTT: aPTT: 33 seconds (ref 24–36)

## 2018-07-23 SURGERY — ARTHROPLASTY, HIP, TOTAL,POSTERIOR APPROACH
Anesthesia: General | Site: Hip | Laterality: Right

## 2018-07-23 MED ORDER — OXYCODONE HCL 5 MG PO TABS
10.0000 mg | ORAL_TABLET | Freq: Two times a day (BID) | ORAL | Status: DC
  Administered 2018-07-23 – 2018-07-25 (×2): 10 mg via ORAL
  Filled 2018-07-23 (×2): qty 2

## 2018-07-23 MED ORDER — FENTANYL CITRATE (PF) 100 MCG/2ML IJ SOLN
INTRAMUSCULAR | Status: AC
  Administered 2018-07-23: 50 ug via INTRAVENOUS
  Filled 2018-07-23: qty 4

## 2018-07-23 MED ORDER — NITROGLYCERIN 0.4 MG SL SUBL: 0.4000 mg | SUBLINGUAL_TABLET | SUBLINGUAL | Status: DC | PRN

## 2018-07-23 MED ORDER — METOCLOPRAMIDE HCL 5 MG PO TABS: 5.0000 mg | ORAL_TABLET | Freq: Three times a day (TID) | ORAL | Status: DC | PRN

## 2018-07-23 MED ORDER — ROCURONIUM BROMIDE 50 MG/5ML IV SOSY
PREFILLED_SYRINGE | INTRAVENOUS | Status: DC | PRN
  Administered 2018-07-23 (×3): 10 mg via INTRAVENOUS
  Administered 2018-07-23: 50 mg via INTRAVENOUS
  Administered 2018-07-23 (×2): 10 mg via INTRAVENOUS

## 2018-07-23 MED ORDER — DEXAMETHASONE SODIUM PHOSPHATE 10 MG/ML IJ SOLN: 8.0000 mg | Freq: Once | INTRAMUSCULAR | Status: DC

## 2018-07-23 MED ORDER — LACTATED RINGERS IV SOLN
INTRAVENOUS | Status: DC
  Administered 2018-07-23: 10:00:00 via INTRAVENOUS

## 2018-07-23 MED ORDER — CHLORHEXIDINE GLUCONATE 4 % EX LIQD: 60.0000 mL | Freq: Once | CUTANEOUS | Status: DC

## 2018-07-23 MED ORDER — ONDANSETRON HCL 4 MG PO TABS: 4.0000 mg | ORAL_TABLET | Freq: Four times a day (QID) | ORAL | Status: DC | PRN

## 2018-07-23 MED ORDER — DOCUSATE SODIUM 100 MG PO CAPS
100.0000 mg | ORAL_CAPSULE | Freq: Two times a day (BID) | ORAL | Status: DC
  Administered 2018-07-23 – 2018-07-25 (×3): 100 mg via ORAL
  Filled 2018-07-23 (×4): qty 1

## 2018-07-23 MED ORDER — TRANEXAMIC ACID-NACL 1000-0.7 MG/100ML-% IV SOLN
1000.0000 mg | INTRAVENOUS | Status: AC
  Administered 2018-07-23: 1000 mg via INTRAVENOUS
  Filled 2018-07-23: qty 100

## 2018-07-23 MED ORDER — RIVAROXABAN 10 MG PO TABS
10.0000 mg | ORAL_TABLET | Freq: Every day | ORAL | Status: DC
  Administered 2018-07-24 – 2018-07-25 (×2): 10 mg via ORAL
  Filled 2018-07-23 (×2): qty 1

## 2018-07-23 MED ORDER — KETOROLAC TROMETHAMINE 15 MG/ML IJ SOLN: 15.0000 mg | Freq: Once | INTRAMUSCULAR | Status: DC | PRN

## 2018-07-23 MED ORDER — ALBUMIN HUMAN 5 % IV SOLN
INTRAVENOUS | Status: AC
  Filled 2018-07-23: qty 250

## 2018-07-23 MED ORDER — ONDANSETRON HCL 4 MG/2ML IJ SOLN
INTRAMUSCULAR | Status: DC | PRN
  Administered 2018-07-23: 4 mg via INTRAVENOUS

## 2018-07-23 MED ORDER — CEFAZOLIN SODIUM-DEXTROSE 2-4 GM/100ML-% IV SOLN
2.0000 g | Freq: Four times a day (QID) | INTRAVENOUS | Status: AC
  Administered 2018-07-23 (×2): 2 g via INTRAVENOUS
  Filled 2018-07-23 (×2): qty 100

## 2018-07-23 MED ORDER — LACTATED RINGERS IV SOLN
INTRAVENOUS | Status: DC | PRN
  Administered 2018-07-23: 14:00:00 via INTRAVENOUS

## 2018-07-23 MED ORDER — HYDROMORPHONE HCL 1 MG/ML IJ SOLN
INTRAMUSCULAR | Status: DC | PRN
  Administered 2018-07-23 (×4): .5 mg via INTRAVENOUS

## 2018-07-23 MED ORDER — ATORVASTATIN CALCIUM 10 MG PO TABS
10.0000 mg | ORAL_TABLET | Freq: Every day | ORAL | Status: DC
  Administered 2018-07-24 – 2018-07-25 (×2): 10 mg via ORAL
  Filled 2018-07-23 (×2): qty 1

## 2018-07-23 MED ORDER — SODIUM CHLORIDE 0.9 % IV SOLN
INTRAVENOUS | Status: DC | PRN
  Administered 2018-07-23: 50 ug/min via INTRAVENOUS

## 2018-07-23 MED ORDER — DIPHENHYDRAMINE HCL 12.5 MG/5ML PO ELIX: 12.5000 mg | ORAL_SOLUTION | ORAL | Status: DC | PRN

## 2018-07-23 MED ORDER — FENTANYL CITRATE (PF) 100 MCG/2ML IJ SOLN
INTRAMUSCULAR | Status: AC
  Filled 2018-07-23: qty 2

## 2018-07-23 MED ORDER — SUGAMMADEX SODIUM 200 MG/2ML IV SOLN
INTRAVENOUS | Status: AC
  Filled 2018-07-23: qty 2

## 2018-07-23 MED ORDER — PROPOFOL 10 MG/ML IV BOLUS
INTRAVENOUS | Status: AC
  Filled 2018-07-23: qty 40

## 2018-07-23 MED ORDER — POLYETHYLENE GLYCOL 3350 17 G PO PACK: 17.0000 g | PACK | Freq: Every day | ORAL | Status: DC | PRN

## 2018-07-23 MED ORDER — DEXAMETHASONE SODIUM PHOSPHATE 10 MG/ML IJ SOLN
10.0000 mg | Freq: Once | INTRAMUSCULAR | Status: AC
  Administered 2018-07-24: 10 mg via INTRAVENOUS
  Filled 2018-07-23: qty 1

## 2018-07-23 MED ORDER — ACETAMINOPHEN 325 MG PO TABS: 325.0000 mg | ORAL_TABLET | Freq: Four times a day (QID) | ORAL | Status: DC | PRN

## 2018-07-23 MED ORDER — STERILE WATER FOR IRRIGATION IR SOLN
Status: DC | PRN
  Administered 2018-07-23: 2000 mL

## 2018-07-23 MED ORDER — FENTANYL CITRATE (PF) 100 MCG/2ML IJ SOLN
25.0000 ug | INTRAMUSCULAR | Status: DC | PRN
  Administered 2018-07-23 (×3): 50 ug via INTRAVENOUS

## 2018-07-23 MED ORDER — ACETAMINOPHEN 10 MG/ML IV SOLN
1000.0000 mg | Freq: Four times a day (QID) | INTRAVENOUS | Status: DC
  Administered 2018-07-23: 1000 mg via INTRAVENOUS
  Filled 2018-07-23: qty 100

## 2018-07-23 MED ORDER — ONDANSETRON HCL 4 MG/2ML IJ SOLN: 4.0000 mg | Freq: Once | INTRAMUSCULAR | Status: DC | PRN

## 2018-07-23 MED ORDER — LEVOTHYROXINE SODIUM 25 MCG PO TABS
25.0000 ug | ORAL_TABLET | Freq: Every day | ORAL | Status: DC
  Administered 2018-07-24 – 2018-07-25 (×2): 25 ug via ORAL
  Filled 2018-07-23 (×2): qty 1

## 2018-07-23 MED ORDER — ONDANSETRON HCL 4 MG/2ML IJ SOLN: 4.0000 mg | Freq: Four times a day (QID) | INTRAMUSCULAR | Status: DC | PRN

## 2018-07-23 MED ORDER — BISACODYL 10 MG RE SUPP: 10.0000 mg | Freq: Every day | RECTAL | Status: DC | PRN

## 2018-07-23 MED ORDER — METHOCARBAMOL 500 MG IVPB - SIMPLE MED
INTRAVENOUS | Status: AC
  Administered 2018-07-23: 500 mg via INTRAVENOUS
  Filled 2018-07-23: qty 50

## 2018-07-23 MED ORDER — FENTANYL CITRATE (PF) 100 MCG/2ML IJ SOLN
INTRAMUSCULAR | Status: DC | PRN
  Administered 2018-07-23: 100 ug via INTRAVENOUS

## 2018-07-23 MED ORDER — HYDROMORPHONE HCL 2 MG/ML IJ SOLN
INTRAMUSCULAR | Status: AC
  Filled 2018-07-23: qty 1

## 2018-07-23 MED ORDER — PHENYLEPHRINE 40 MCG/ML (10ML) SYRINGE FOR IV PUSH (FOR BLOOD PRESSURE SUPPORT)
PREFILLED_SYRINGE | INTRAVENOUS | Status: AC
  Filled 2018-07-23: qty 10

## 2018-07-23 MED ORDER — GLYBURIDE 2.5 MG PO TABS
2.5000 mg | ORAL_TABLET | Freq: Every day | ORAL | Status: DC
Start: 2018-07-24 — End: 2018-07-25
  Administered 2018-07-24 – 2018-07-25 (×2): 2.5 mg via ORAL
  Filled 2018-07-23 (×2): qty 1

## 2018-07-23 MED ORDER — METHOCARBAMOL 500 MG PO TABS
500.0000 mg | ORAL_TABLET | Freq: Four times a day (QID) | ORAL | Status: DC | PRN
  Administered 2018-07-24 (×3): 500 mg via ORAL
  Filled 2018-07-23 (×3): qty 1

## 2018-07-23 MED ORDER — PHENYLEPHRINE 40 MCG/ML (10ML) SYRINGE FOR IV PUSH (FOR BLOOD PRESSURE SUPPORT)
PREFILLED_SYRINGE | INTRAVENOUS | Status: DC | PRN
  Administered 2018-07-23: 120 ug via INTRAVENOUS
  Administered 2018-07-23 (×2): 80 ug via INTRAVENOUS

## 2018-07-23 MED ORDER — METOCLOPRAMIDE HCL 5 MG/ML IJ SOLN: 5.0000 mg | Freq: Three times a day (TID) | INTRAMUSCULAR | Status: DC | PRN

## 2018-07-23 MED ORDER — AMLODIPINE BESYLATE 5 MG PO TABS
5.0000 mg | ORAL_TABLET | Freq: Every day | ORAL | Status: DC
  Administered 2018-07-24 – 2018-07-25 (×2): 5 mg via ORAL
  Filled 2018-07-23 (×2): qty 1

## 2018-07-23 MED ORDER — DEXAMETHASONE SODIUM PHOSPHATE 10 MG/ML IJ SOLN
INTRAMUSCULAR | Status: DC | PRN
  Administered 2018-07-23: 10 mg via INTRAVENOUS

## 2018-07-23 MED ORDER — PHENOL 1.4 % MT LIQD: 1.0000 | OROMUCOSAL | Status: DC | PRN

## 2018-07-23 MED ORDER — OXYCODONE HCL 5 MG PO TABS
5.0000 mg | ORAL_TABLET | ORAL | Status: DC | PRN
  Administered 2018-07-24: 10 mg via ORAL
  Filled 2018-07-23 (×3): qty 2

## 2018-07-23 MED ORDER — 0.9 % SODIUM CHLORIDE (POUR BTL) OPTIME
TOPICAL | Status: DC | PRN
  Administered 2018-07-23: 1000 mL

## 2018-07-23 MED ORDER — SODIUM CHLORIDE (PF) 0.9 % IJ SOLN
INTRAMUSCULAR | Status: AC
  Filled 2018-07-23: qty 10

## 2018-07-23 MED ORDER — MIDAZOLAM HCL 2 MG/2ML IJ SOLN
INTRAMUSCULAR | Status: AC
  Filled 2018-07-23: qty 2

## 2018-07-23 MED ORDER — SODIUM CHLORIDE 0.9% IV SOLUTION: Freq: Once | INTRAVENOUS | Status: DC

## 2018-07-23 MED ORDER — BUPIVACAINE-EPINEPHRINE (PF) 0.25% -1:200000 IJ SOLN
INTRAMUSCULAR | Status: AC
  Filled 2018-07-23: qty 30

## 2018-07-23 MED ORDER — PHENYLEPHRINE HCL 10 MG/ML IJ SOLN
INTRAMUSCULAR | Status: AC
  Filled 2018-07-23: qty 1

## 2018-07-23 MED ORDER — MENTHOL 3 MG MT LOZG: 1.0000 | LOZENGE | OROMUCOSAL | Status: DC | PRN

## 2018-07-23 MED ORDER — SODIUM CHLORIDE 0.9 % IV SOLN
INTRAVENOUS | Status: DC | PRN
  Administered 2018-07-23: 14:00:00 via INTRAVENOUS

## 2018-07-23 MED ORDER — METHOCARBAMOL 500 MG IVPB - SIMPLE MED
500.0000 mg | Freq: Four times a day (QID) | INTRAVENOUS | Status: DC | PRN
  Administered 2018-07-23: 500 mg via INTRAVENOUS
  Filled 2018-07-23: qty 50

## 2018-07-23 MED ORDER — KETAMINE HCL 10 MG/ML IJ SOLN
INTRAMUSCULAR | Status: DC | PRN
  Administered 2018-07-23: 30 mg via INTRAVENOUS

## 2018-07-23 MED ORDER — PROPOFOL 10 MG/ML IV BOLUS
INTRAVENOUS | Status: DC | PRN
  Administered 2018-07-23: 140 mg via INTRAVENOUS

## 2018-07-23 MED ORDER — SODIUM CHLORIDE 0.9 % IV SOLN
INTRAVENOUS | Status: DC
  Administered 2018-07-23: 17:00:00 via INTRAVENOUS

## 2018-07-23 MED ORDER — DULOXETINE HCL 20 MG PO CPEP
20.0000 mg | ORAL_CAPSULE | Freq: Every day | ORAL | Status: DC
  Administered 2018-07-24 – 2018-07-25 (×2): 20 mg via ORAL
  Filled 2018-07-23 (×2): qty 1

## 2018-07-23 MED ORDER — SUGAMMADEX SODIUM 200 MG/2ML IV SOLN
INTRAVENOUS | Status: DC | PRN
  Administered 2018-07-23: 200 mg via INTRAVENOUS

## 2018-07-23 MED ORDER — FENTANYL 25 MCG/HR TD PT72
25.0000 ug | MEDICATED_PATCH | TRANSDERMAL | Status: DC
  Administered 2018-07-23: 25 ug via TRANSDERMAL
  Filled 2018-07-23: qty 1

## 2018-07-23 MED ORDER — TRANEXAMIC ACID-NACL 1000-0.7 MG/100ML-% IV SOLN
1000.0000 mg | Freq: Once | INTRAVENOUS | Status: AC
  Administered 2018-07-23: 1000 mg via INTRAVENOUS
  Filled 2018-07-23: qty 100

## 2018-07-23 MED ORDER — LIDOCAINE 2% (20 MG/ML) 5 ML SYRINGE
INTRAMUSCULAR | Status: DC | PRN
  Administered 2018-07-23: 60 mg via INTRAVENOUS

## 2018-07-23 MED ORDER — FLEET ENEMA 7-19 GM/118ML RE ENEM: 1.0000 | ENEMA | Freq: Once | RECTAL | Status: DC | PRN

## 2018-07-23 MED ORDER — OXYCODONE HCL 5 MG PO TABS
10.0000 mg | ORAL_TABLET | ORAL | Status: DC | PRN
  Administered 2018-07-23: 10 mg via ORAL
  Administered 2018-07-24: 15 mg via ORAL
  Administered 2018-07-24: 10 mg via ORAL
  Administered 2018-07-24 – 2018-07-25 (×3): 15 mg via ORAL
  Filled 2018-07-23 (×4): qty 3

## 2018-07-23 MED ORDER — ALBUMIN HUMAN 5 % IV SOLN
INTRAVENOUS | Status: DC | PRN
  Administered 2018-07-23 (×3): via INTRAVENOUS

## 2018-07-23 MED ORDER — DEXAMETHASONE SODIUM PHOSPHATE 10 MG/ML IJ SOLN
INTRAMUSCULAR | Status: AC
  Filled 2018-07-23: qty 1

## 2018-07-23 MED ORDER — ONDANSETRON HCL 4 MG/2ML IJ SOLN
INTRAMUSCULAR | Status: AC
  Filled 2018-07-23: qty 2

## 2018-07-23 SURGICAL SUPPLY — 70 items
BAG DECANTER FOR FLEXI CONT (MISCELLANEOUS) ×3 IMPLANT
BAG ZIPLOCK 12X15 (MISCELLANEOUS) IMPLANT
BALL HIP ARTICU EZE 36 8.5 (Hips) ×1 IMPLANT
BIT DRILL 2.8X128 (BIT) ×2 IMPLANT
BIT DRILL 2.8X128MM (BIT) ×1
BLADE EXTENDED COATED 6.5IN (ELECTRODE) ×3 IMPLANT
BLADE SAW SAG 73X25 THK (BLADE) ×1
BLADE SAW SGTL 73X25 THK (BLADE) ×2 IMPLANT
CABLE CERLAGE W/CRIMP 1.8 (Cable) ×8 IMPLANT
CABLE CERLAGE W/CRIMP 1.8MM (Cable) ×4 IMPLANT
CLOSURE WOUND 1/2 X4 (GAUZE/BANDAGES/DRESSINGS) ×2
COVER MAYO STAND STRL (DRAPES) ×3 IMPLANT
COVER SURGICAL LIGHT HANDLE (MISCELLANEOUS) ×3 IMPLANT
COVER WAND RF STERILE (DRAPES) ×3 IMPLANT
CUP ACETAB PIN MULTI 54MM (Orthopedic Implant) ×3 IMPLANT
DECANTER SPIKE VIAL GLASS SM (MISCELLANEOUS) ×3 IMPLANT
DRAPE INCISE IOBAN 66X45 STRL (DRAPES) ×6 IMPLANT
DRAPE LG THREE QUARTER DISP (DRAPES) ×3 IMPLANT
DRAPE ORTHO SPLIT 77X108 STRL (DRAPES) ×4
DRAPE POUCH INSTRU U-SHP 10X18 (DRAPES) ×3 IMPLANT
DRAPE SURG ORHT 6 SPLT 77X108 (DRAPES) ×2 IMPLANT
DRAPE U-SHAPE 47X51 STRL (DRAPES) ×3 IMPLANT
DRSG ADAPTIC 3X8 NADH LF (GAUZE/BANDAGES/DRESSINGS) ×3 IMPLANT
DRSG MEPILEX BORDER 4X12 (GAUZE/BANDAGES/DRESSINGS) ×3 IMPLANT
DRSG MEPILEX BORDER 4X4 (GAUZE/BANDAGES/DRESSINGS) ×3 IMPLANT
DRSG MEPILEX BORDER 4X8 (GAUZE/BANDAGES/DRESSINGS) ×3 IMPLANT
DURAPREP 26ML APPLICATOR (WOUND CARE) ×3 IMPLANT
ELECT BLADE TIP CTD 4 INCH (ELECTRODE) ×3 IMPLANT
ELECT REM PT RETURN 15FT ADLT (MISCELLANEOUS) ×3 IMPLANT
EVACUATOR 1/8 PVC DRAIN (DRAIN) ×3 IMPLANT
FACESHIELD WRAPAROUND (MASK) ×12 IMPLANT
GAUZE SPONGE 4X4 12PLY STRL (GAUZE/BANDAGES/DRESSINGS) ×3 IMPLANT
GLOVE BIO SURGEON STRL SZ7 (GLOVE) ×3 IMPLANT
GLOVE BIO SURGEON STRL SZ8 (GLOVE) ×3 IMPLANT
GLOVE BIOGEL PI IND STRL 7.0 (GLOVE) ×2 IMPLANT
GLOVE BIOGEL PI IND STRL 8 (GLOVE) ×1 IMPLANT
GLOVE BIOGEL PI INDICATOR 7.0 (GLOVE) ×4
GLOVE BIOGEL PI INDICATOR 8 (GLOVE) ×2
GOWN STRL REUS W/TWL LRG LVL3 (GOWN DISPOSABLE) ×6 IMPLANT
GUIDEWIRE BALL NOSE 80CM (WIRE) ×3 IMPLANT
HIP BALL ARTICU EZE 36 8.5 (Hips) ×3 IMPLANT
IMMOBILIZER KNEE 20 (SOFTGOODS) ×3
IMMOBILIZER KNEE 20 THIGH 36 (SOFTGOODS) ×1 IMPLANT
LINER MARATHON NEUT +4X54X36 (Hips) ×3 IMPLANT
MANIFOLD NEPTUNE II (INSTRUMENTS) ×3 IMPLANT
NDL SAFETY ECLIPSE 18X1.5 (NEEDLE) ×2 IMPLANT
NEEDLE HYPO 18GX1.5 SHARP (NEEDLE) ×4
NS IRRIG 1000ML POUR BTL (IV SOLUTION) ×3 IMPLANT
PADDING CAST COTTON 6X4 STRL (CAST SUPPLIES) ×3 IMPLANT
PASSER SUT SWANSON 36MM LOOP (INSTRUMENTS) ×3 IMPLANT
POSITIONER SURGICAL ARM (MISCELLANEOUS) ×3 IMPLANT
SCREW 6.5MMX25MM (Screw) ×3 IMPLANT
SCREW 6.5MMX30MM (Screw) ×3 IMPLANT
SCREW PINN CAN BONE 6.5MMX15MM (Screw) ×3 IMPLANT
SOLUTION SLEEVE STRT 8IN 18 (Hips) ×3 IMPLANT
STAPLER VISISTAT 35W (STAPLE) ×6 IMPLANT
STRIP CLOSURE SKIN 1/2X4 (GAUZE/BANDAGES/DRESSINGS) ×4 IMPLANT
SUT ETHIBOND NAB CT1 #1 30IN (SUTURE) ×6 IMPLANT
SUT MNCRL AB 4-0 PS2 18 (SUTURE) ×3 IMPLANT
SUT STRATAFIX 0 PDS 27 VIOLET (SUTURE) ×9
SUT VIC AB 2-0 CT1 27 (SUTURE) ×6
SUT VIC AB 2-0 CT1 TAPERPNT 27 (SUTURE) ×3 IMPLANT
SUTURE STRATFX 0 PDS 27 VIOLET (SUTURE) ×3 IMPLANT
SYR 20CC LL (SYRINGE) ×3 IMPLANT
SYR 50ML LL SCALE MARK (SYRINGE) ×3 IMPLANT
TOWEL OR 17X26 10 PK STRL BLUE (TOWEL DISPOSABLE) ×6 IMPLANT
TOWEL OR NON WOVEN STRL DISP B (DISPOSABLE) ×3 IMPLANT
TRAY FOLEY MTR SLVR 16FR STAT (SET/KITS/TRAYS/PACK) ×3 IMPLANT
WATER STERILE IRR 1000ML POUR (IV SOLUTION) ×3 IMPLANT
YANKAUER SUCT BULB TIP 10FT TU (MISCELLANEOUS) ×3 IMPLANT

## 2018-07-23 NOTE — Progress Notes (Signed)
PT Cancellation Note  Patient Details Name: Phillip Grant Arman MRN: [id number] DOB: 05-Nov-1940   Cancelled Treatment:    Reason Eval/Treat Not Completed: Fatigue/lethargy limiting ability to participate;Pain limiting ability to participate -Pt reports being in pain and needing to sleep, and wanting to wait until tomorrow to mobilize. PT encouraged mobility evening of surgery, but pt deferred. PT to follow up tomorrow.   Nicola PoliceAlexa D Sony Schlarb, PT Acute Rehabilitation Services Pager [phone]  Office [phone]    Tyrone AppleAlexa D Despina Hiddenure 07/23/2018, 7:23 PM

## 2018-07-23 NOTE — Interval H&P Note (Signed)
History and Physical Interval Note:  07/23/2018 10:17 AM  Phillip Grant  has presented today for surgery, with the diagnosis of right hip osteoarthritis  The various methods of treatment have been discussed with the patient and family. After consideration of risks, benefits and other options for treatment, the patient has consented to  Procedure(s) with comments: RIGHT TOTAL HIP ARTHROPLASTY-Posterior (Right) - 150min as a surgical intervention .  The patient's history has been reviewed, patient examined, no change in status, stable for surgery.  I have reviewed the patient's chart and labs.  Questions were answered to the patient's satisfaction.     Homero FellersFrank Jeani Fassnacht

## 2018-07-23 NOTE — Transfer of Care (Signed)
Immediate Anesthesia Transfer of Care Note  Patient: Phillip Grant  Procedure(s) Performed: RIGHT TOTAL HIP ARTHROPLASTY-Posterior (Right Hip)  Patient Location: PACU  Anesthesia Type:General  Level of Consciousness: awake, alert  and patient cooperative  Airway & Oxygen Therapy: Patient Spontanous Breathing and Patient connected to face mask oxygen  Post-op Assessment: Report given to RN and Post -op Vital signs reviewed and stable  Post vital signs: Reviewed and stable  Last Vitals:  Vitals Value Taken Time  BP 160/77 07/23/2018  3:30 PM  Temp    Pulse 96 07/23/2018  3:32 PM  Resp 16 07/23/2018  3:32 PM  SpO2 97 % 07/23/2018  3:32 PM  Vitals shown include unvalidated device data.  Last Pain:  Vitals:   07/23/18 0933  TempSrc: Oral         Complications: No apparent anesthesia complications

## 2018-07-23 NOTE — Anesthesia Postprocedure Evaluation (Signed)
Anesthesia Post Note  Patient: Phillip Grant  Procedure(s) Performed: RIGHT TOTAL HIP ARTHROPLASTY-Posterior (Right Hip)     Patient location during evaluation: PACU Anesthesia Type: General Level of consciousness: awake and alert Pain management: pain level controlled Vital Signs Assessment: post-procedure vital signs reviewed and stable Respiratory status: spontaneous breathing, nonlabored ventilation, respiratory function stable and patient connected to nasal cannula oxygen Cardiovascular status: blood pressure returned to baseline and stable Postop Assessment: no apparent nausea or vomiting Anesthetic complications: no    Last Vitals:  Vitals:   07/23/18 1924 07/23/18 2005  BP: 139/71 (!) 149/69  Pulse: (!) 107 (!) 102  Resp: 14 12  Temp: 37.5 C 37.2 C  SpO2:  98%    Last Pain:  Vitals:   07/23/18 1924  TempSrc: Oral  PainSc:                  Trevor IhaStephen A Houser

## 2018-07-23 NOTE — Anesthesia Procedure Notes (Signed)
Procedure Name: Intubation Date/Time: 07/23/2018 11:37 AM Performed by: Florene Routeeardon, Kristine Chahal L, CRNA Patient Re-evaluated:Patient Re-evaluated prior to induction Oxygen Delivery Method: Circle system utilized Preoxygenation: Pre-oxygenation with 100% oxygen Induction Type: IV induction Ventilation: Mask ventilation without difficulty and Oral airway inserted - appropriate to patient size Laryngoscope Size: Miller and 3 Grade View: Grade I Tube type: Oral Tube size: 7.5 mm Number of attempts: 1 Airway Equipment and Method: Stylet Placement Confirmation: ETT inserted through vocal cords under direct vision,  positive ETCO2 and breath sounds checked- equal and bilateral Secured at: 22 cm Tube secured with: Tape Dental Injury: Teeth and Oropharynx as per pre-operative assessment

## 2018-07-23 NOTE — Discharge Instructions (Addendum)
Dr. Gaynelle Arabian Total Joint Specialist [facility] [address] [place], [place] [address] [phone]  POSTERIOR TOTAL HIP REPLACEMENT POSTOPERATIVE DIRECTIONS  Hip Rehabilitation, Guidelines Following Surgery  The results of a hip operation are greatly improved after range of motion and muscle strengthening exercises. Follow all safety measures which are given to protect your hip. If any of these exercises cause increased pain or swelling in your joint, decrease the amount until you are comfortable again. Then slowly increase the exercises. Call your caregiver if you have problems or questions.   HOME CARE INSTRUCTIONS   Remove items at home which could result in a fall. This includes throw rugs or furniture in walking pathways.   ICE to the affected hip every three hours for 30 minutes at a time and then as needed for pain and swelling.  Continue to use ice on the hip for pain and swelling from surgery. You may notice swelling that will progress down to the foot and ankle.  This is normal after surgery.  Elevate the leg when you are not up walking on it.    Continue to use the breathing machine which will help keep your temperature down.  It is common for your temperature to cycle up and down following surgery, especially at night when you are not up moving around and exerting yourself.  The breathing machine keeps your lungs expanded and your temperature down.  DIET You may resume your previous home diet once your are discharged from the hospital.  DRESSING / WOUND CARE / SHOWERING You may shower 3 days after surgery, but keep the wounds dry during showering.  You may use an occlusive plastic wrap (Press'n Seal for example), NO SOAKING/SUBMERGING IN THE BATHTUB.  If the bandage gets wet, change with a clean dry gauze.  If the incision gets wet, pat the wound dry with a clean towel. You may start showering once you are discharged home but do not submerge the incision  under water. Just pat the incision dry and apply a dry gauze dressing on daily. Change the surgical dressing daily and reapply a dry dressing each time.  ACTIVITY Walk with your walker as instructed. Use walker as long as suggested by your caregivers. Avoid periods of inactivity such as sitting longer than an hour when not asleep. This helps prevent blood clots.  You may resume a sexual relationship in one month or when given the OK by your doctor.  You may return to work once you are cleared by your doctor.  Do not drive a car for 6 weeks or until released by you surgeon.  Do not drive while taking narcotics.  WEIGHT BEARING Weight bearing as tolerated with assist device (walker, cane, etc) as directed, use it as long as suggested by your surgeon or therapist, typically at least 4-6 weeks.  POSTOPERATIVE CONSTIPATION PROTOCOL Constipation - defined medically as fewer than three stools per week and severe constipation as less than one stool per week.  One of the most common issues patients have following surgery is constipation.  Even if you have a regular bowel pattern at home, your normal regimen is likely to be disrupted due to multiple reasons following surgery.  Combination of anesthesia, postoperative narcotics, change in appetite and fluid intake all can affect your bowels.  In order to avoid complications following surgery, here are some recommendations in order to help you during your recovery period.  Colace (docusate) - Pick up an over-the-counter form of  Colace or another stool softener and take twice a day as long as you are requiring postoperative pain medications.  Take with a full glass of water daily.  If you experience loose stools or diarrhea, hold the colace until you stool forms back up.  If your symptoms do not get better within 1 week or if they get worse, check with your doctor.  Dulcolax (bisacodyl) - Pick up over-the-counter and take as directed by the product  packaging as needed to assist with the movement of your bowels.  Take with a full glass of water.  Use this product as needed if not relieved by Colace only.   MiraLax (polyethylene glycol) - Pick up over-the-counter to have on hand.  MiraLax is a solution that will increase the amount of water in your bowels to assist with bowel movements.  Take as directed and can mix with a glass of water, juice, soda, coffee, or tea.  Take if you go more than two days without a movement. Do not use MiraLax more than once per day. Call your doctor if you are still constipated or irregular after using this medication for 7 days in a row.  If you continue to have problems with postoperative constipation, please contact the office for further assistance and recommendations.  If you experience "the worst abdominal pain ever" or develop nausea or vomiting, please contact the office immediatly for further recommendations for treatment.  ITCHING  If you experience itching with your medications, try taking only a single pain pill, or even half a pain pill at a time.  You can also use Benadryl over the counter for itching or also to help with sleep.   TED HOSE STOCKINGS Wear the elastic stockings on both legs for three weeks following surgery during the day but you may remove then at night for sleeping.  MEDICATIONS See your medication summary on the After Visit Summary that the nursing staff will review with you prior to discharge.  You may have some home medications which will be placed on hold until you complete the course of blood thinner medication.  It is important for you to complete the blood thinner medication as prescribed by your surgeon.  Continue your approved medications as instructed at time of discharge.  PRECAUTIONS If you experience chest pain or shortness of breath - call 911 immediately for transfer to the hospital emergency department.  If you develop a fever greater that 101 F, purulent drainage  from wound, increased redness or drainage from wound, foul odor from the wound/dressing, or calf pain - CONTACT YOUR SURGEON.                                                   FOLLOW-UP APPOINTMENTS Make sure you keep all of your appointments after your operation with your surgeon and caregivers. You should call the office at the above phone number and make an appointment for approximately two weeks after the date of your surgery or on the date instructed by your surgeon outlined in the "After Visit Summary".  RANGE OF MOTION AND STRENGTHENING EXERCISES  These exercises are designed to help you keep full movement of your hip joint. Follow your caregiver's or physical therapist's instructions. Perform all exercises about fifteen times, three times per day or as directed. Exercise both hips, even if you have had  only one joint replacement. These exercises can be done on a training (exercise) mat, on the floor, on a table or on a bed. Use whatever works the best and is most comfortable for you. Use music or television while you are exercising so that the exercises are a pleasant break in your day. This will make your life better with the exercises acting as a break in routine you can look forward to.   Lying on your back, slowly slide your foot toward your buttocks, raising your knee up off the floor. Then slowly slide your foot back down until your leg is straight again.   Lying on your back spread your legs as far apart as you can without causing discomfort.   Lying on your side, raise your upper leg and foot straight up from the floor as far as is comfortable. Slowly lower the leg and repeat.   Lying on your back, tighten up the muscle in the front of your thigh (quadriceps muscles). You can do this by keeping your leg straight and trying to raise your heel off the floor. This helps strengthen the largest muscle supporting your knee.   Lying on your back, tighten up the muscles of your buttocks both  with the legs straight and with the knee bent at a comfortable angle while keeping your heel on the floor.   IF YOU ARE TRANSFERRED TO A SKILLED REHAB FACILITY If the patient is transferred to a skilled rehab facility following release from the hospital, a list of the current medications will be sent to the facility for the patient to continue.  When discharged from the skilled rehab facility, please have the facility set up the patient's Home Health Physical Therapy prior to being released. Also, the skilled facility will be responsible for providing the patient with their medications at time of release from the facility to include their pain medication, the muscle relaxants, and their blood thinner medication. If the patient is still at the rehab facility at time of the two week follow up appointment, the skilled rehab facility will also need to assist the patient in arranging follow up appointment in our office and any transportation needs.  MAKE SURE YOU:   Understand these instructions.   Get help right away if you are not doing well or get worse.    Pick up stool softner and laxative for home use following surgery while on pain medications. Do not submerge incision under water. Please use good hand washing techniques while changing dressing each day. May shower starting three days after surgery. Please use a clean towel to pat the incision dry following showers. Continue to use ice for pain and swelling after surgery. Do not use any lotions or creams on the incision until instructed by your surgeon.   Information on my medicine - XARELTO (Rivaroxaban)  This medication education was reviewed with me or my healthcare representative as part of my discharge preparation.  The pharmacist that spoke with me during my hospital stay was:   Why was Xarelto prescribed for you? Xarelto was prescribed for you to reduce the risk of blood clots forming after orthopedic surgery. The medical term  for these abnormal blood clots is venous thromboembolism (VTE).  What do you need to know about xarelto ? Take your Xarelto ONCE DAILY at the same time every day. You may take it either with or without food.  If you have difficulty swallowing the tablet whole, you may crush it and mix in  applesauce just prior to taking your dose.  Take Xarelto exactly as prescribed by your doctor and DO NOT stop taking Xarelto without talking to the doctor who prescribed the medication.  Stopping without other VTE prevention medication to take the place of Xarelto may increase your risk of developing a clot.  After discharge, you should have regular check-up appointments with your healthcare provider that is prescribing your Xarelto.    What do you do if you miss a dose? If you miss a dose, take it as soon as you remember on the same day then continue your regularly scheduled once daily regimen the next day. Do not take two doses of Xarelto on the same day.   Important Safety Information A possible side effect of Xarelto is bleeding. You should call your healthcare provider right away if you experience any of the following: ? Bleeding from an injury or your nose that does not stop. ? Unusual colored urine (red or dark brown) or unusual colored stools (red or black). ? Unusual bruising for unknown reasons. ? A serious fall or if you hit your head (even if there is no bleeding).  Some medicines may interact with Xarelto and might increase your risk of bleeding while on Xarelto. To help avoid this, consult your healthcare provider or pharmacist prior to using any new prescription or non-prescription medications, including herbals, vitamins, non-steroidal anti-inflammatory drugs (NSAIDs) and supplements.  This website has more information on Xarelto: VisitDestination.com.br.

## 2018-07-23 NOTE — Brief Op Note (Signed)
07/23/2018  3:06 PM  PATIENT:  Georjean ModeLeslie W Marlowe  77 y.o. male  PRE-OPERATIVE DIAGNOSIS:  right hip osteoarthritis  POST-OPERATIVE DIAGNOSIS:  right hip osteoarthritis  PROCEDURE:  Procedure(s) with comments: RIGHT TOTAL HIP ARTHROPLASTY-Posterior (Right) - 150min  SURGEON:  Surgeon(s) and Role:    Ollen Gross* Tamzin Bertling, MD - Primary  PHYSICIAN ASSISTANT:   ASSISTANTS: Arther AbbottKristie Edmisten, PA-C   ANESTHESIA:   general  EBL:  1700 mL   DRAINS: (Medium) Hemovact drain(s) in the right hip with  Suction Open   LOCAL MEDICATIONS USED:  NONE  COUNTS:  YES  TOURNIQUET:  * No tourniquets in log *  DICTATION: .Other Dictation: Dictation Number [id number]  PLAN OF CARE: Admit to inpatient   PATIENT DISPOSITION:  PACU - hemodynamically stable.

## 2018-07-23 NOTE — Care Plan (Signed)
Ortho Bundle Case Management Note  Patient Details  Name: [facility] MRN: [id number] Date of Birth: 11-02-1940  R THA (post.) scheduled on 07-23-18 DCP:  Pennybyrn                   DME Arranged:  N/A DME Agency:  NA  HH Arranged:  NA HH Agency:  NA  Additional Comments: Please contact me with any questions of if this plan should need to change.  Ennis FortsJill A Lauer, RN,CCM [facility]  [phone] 07/23/2018, 11:04 AM

## 2018-07-24 ENCOUNTER — Encounter (HOSPITAL_COMMUNITY): Payer: Self-pay | Admitting: Orthopedic Surgery

## 2018-07-24 LAB — BASIC METABOLIC PANEL WITH GFR
Anion gap: 6 (ref 5–15)
BUN: 36 mg/dL — ABNORMAL HIGH (ref 8–23)
CO2: 25 mmol/L (ref 22–32)
Calcium: 8.1 mg/dL — ABNORMAL LOW (ref 8.9–10.3)
Chloride: 108 mmol/L (ref 98–111)
Creatinine, Ser: 1.54 mg/dL — ABNORMAL HIGH (ref 0.61–1.24)
GFR calc Af Amer: 49 mL/min — ABNORMAL LOW (ref >60)
GFR calc non Af Amer: 42 mL/min — ABNORMAL LOW (ref >60)
Glucose, Bld: 161 mg/dL — ABNORMAL HIGH (ref 70–99)
Potassium: 5.5 mmol/L — ABNORMAL HIGH (ref 3.5–5.1)
Sodium: 139 mmol/L (ref 135–145)

## 2018-07-24 LAB — TYPE AND SCREEN
ABO/RH(D): A POS
Antibody Screen: NEGATIVE
Unit division: 0
Unit division: 0

## 2018-07-24 LAB — CBC
HCT: 35.8 % — ABNORMAL LOW (ref 39.0–52.0)
Hemoglobin: 10.9 g/dL — ABNORMAL LOW (ref 13.0–17.0)
MCH: 27.2 pg (ref 26.0–34.0)
MCHC: 30.4 g/dL (ref 30.0–36.0)
MCV: 89.3 fL (ref 80.0–100.0)
Platelets: 200 K/uL (ref 150–400)
RBC: 4.01 MIL/uL — ABNORMAL LOW (ref 4.22–5.81)
RDW: 15 % (ref 11.5–15.5)
WBC: 12.3 K/uL — ABNORMAL HIGH (ref 4.0–10.5)
nRBC: 0 % (ref 0.0–0.2)

## 2018-07-24 LAB — BPAM RBC
BLOOD PRODUCT EXPIRATION DATE: 201912082359
Blood Product Expiration Date: 201912082359
ISSUE DATE / TIME: 201911131405
ISSUE DATE / TIME: 201911131405
UNIT TYPE AND RH: 6200
Unit Type and Rh: 6200

## 2018-07-24 MED ORDER — ONDANSETRON HCL 4 MG PO TABS: 4.0000 mg | ORAL_TABLET | Freq: Four times a day (QID) | ORAL | 0 refills | Status: DC | PRN

## 2018-07-24 MED ORDER — POLYETHYLENE GLYCOL 3350 17 G PO PACK: 17.0000 g | PACK | Freq: Every day | ORAL | 0 refills | Status: DC | PRN

## 2018-07-24 MED ORDER — OXYCODONE HCL 5 MG PO TABS: 5.0000 mg | ORAL_TABLET | Freq: Four times a day (QID) | ORAL | 0 refills | Status: AC | PRN

## 2018-07-24 MED ORDER — METHOCARBAMOL 500 MG PO TABS: 500.0000 mg | ORAL_TABLET | Freq: Four times a day (QID) | ORAL | 0 refills | Status: AC | PRN

## 2018-07-24 MED ORDER — HYDROMORPHONE HCL 1 MG/ML IJ SOLN
0.5000 mg | INTRAMUSCULAR | Status: DC | PRN
  Administered 2018-07-24: 0.5 mg via INTRAVENOUS
  Filled 2018-07-24: qty 0.5

## 2018-07-24 MED ORDER — DOCUSATE SODIUM 100 MG PO CAPS: 100.0000 mg | ORAL_CAPSULE | Freq: Two times a day (BID) | ORAL | 0 refills | Status: AC

## 2018-07-24 MED ORDER — BISACODYL 10 MG RE SUPP: 10.0000 mg | Freq: Every day | RECTAL | 0 refills | Status: AC | PRN

## 2018-07-24 MED ORDER — RIVAROXABAN 10 MG PO TABS: 10.0000 mg | ORAL_TABLET | Freq: Every day | ORAL | 0 refills | Status: DC

## 2018-07-24 NOTE — Op Note (Signed)
NAME: Phillip [facility], Phillip W. MEDICAL RECORD [id number] ACCOUNT [id number] DATE OF BIRTH:April 09, 1941 FACILITY: [facility] LOCATION: [id number] PHYSICIAN:Avian Greenawalt Dulcy Fanny, MD  OPERATIVE REPORT  DATE OF PROCEDURE:  07/23/2018  PREOPERATIVE DIAGNOSIS:  Severe osteoarthritis, right hip.  POSTOPERATIVE DIAGNOSIS:  Severe osteoarthritis, right hip.  PROCEDURE:  Right total hip arthroplasty.  SURGEON:  Ollen Gross, MD  ASSISTANT:  Arther Abbott, PA-C  ANESTHESIA:  General.  ESTIMATED BLOOD LOSS:  1700.  DRAINS:  Hemovac x1.  COMPLICATIONS:  None.  CONDITION:  Stable to recovery.  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  He has severe pain with this  and is requiring fentanyl patches.  We discussed a total hip arthroplasty in an attempt to help with his pain without any definitive functional improvement given his current functional status.  He presents today for a total hip arthroplasty.  We decided  to do a posterior approach given his acetabular bone loss.  PROCEDURE IN DETAIL:  After successful administration of general anesthetic, the patient was placed in the left lateral decubitus position with the right side up and held with a hip positioner.  Right lower extremity was isolated from his perineum with  plastic drapes and prepped and draped in the usual sterile fashion.  A long posterolateral incision was made with a 10 blade through subcutaneous tissue.  The fascia lata was incised in line with the skin incision.  He essentially had a fusion of his  femoral head to his acetabulum.  I had to dissect the short external rotators and capsule off of the femur, but he had no motion at the site, so I had to use an osteotome to create an osteotomy on the femoral neck so I could detach the femoral shaft from  the neck.   This was completed and hen the femur was retracted anteriorly.  We then began femoral preparation for the S-ROM prosthesis.  The femoral canal was identified and axial reaming initiated.  I felt as though the reaming was going slightly anterior, and indeed it was.  We were able to correct this.  I reamed up to 15.5  mm and proximal reamed to a 42F, and machine the sleeve to an extra, extra-large.  A 20 extra, extra-large trial sleeve was placed.  The femur was then retracted anteriorly to gain acetabular exposure.  Acetabular retractors were placed.  I had to remove the femoral head piecemeal from the acetabulum in order to gain access to the acetabulum.  There was a significant fibrous layer present that was holding the femoral head in place.  After I removed the  bone, I removed that fibrous layer.  There was some central acetabular deficiency, but the medial wall was intact.  There was some superior bone loss also.  He had a high acetabular center due to some of the superior bone loss.  I felt it would be  necessary to put the acetabulum in on high center as we would not be able to get all of his length back due to soft tissue contractures, and were best off leaving the hip center high for inability to get enough length due to the soft tissue contractures.   Acetabular reaming started at 49, coursing up to 53.  A 54 mm Pinnacle Gription multihole acetabular shell was then impacted into the acetabulum.  I had  to abduct it with more than usual due to the bone loss superiorly.  It had good fit, and I placed 3  additional dome screws with excellent purchase.  We had grafted on the medial defect.  This was a very stable cup.  The acetabular liner was a 36 mm neutral +4.  It was then impacted into the acetabular shell.  We again addressed the femur.  In inspecting the femur proximally, it was now noted that his proximal femur did have a fracture just at and below the lesser trochanter.  It was felt that the  S-ROM would not be an appropriate stem because of this.  We did  trial with the S-ROM, and on the x-rays with the trial in place, the stem was out anteriorly.  I thus removed the stem, and we decided to pursue doing a Solution stem, which would bypass that anterior window in the femur.  I first placed 3 cerclage  wires with the Zimmer cables around the proximal femur to reduce that area that had a crack.  His bone was extraordinarily soft, but I was able to get a good reduction with the cables.  I then placed a beaded guide rod into the femoral canal and reamed  up to 17.5 mm for placement of an 18 mm Solution stem.  We then placed the 8-inch 18 mm Solution stem in about 25 degrees anteversion with excellent purchase and excellent stability.  I then trialed with a 36+8.5 head, and the hip reduced with excellent  stability.  I was able to flex to about 70 and the rotation of that was due to all the soft tissue contractures.  He has far more motion at the end of the procedure than he did at the beginning.  We then dislocated the hip and then placed the permanent  36+8.5 metal femoral head.  The hip was reduced, again with excellent stability.  The wound was copiously irrigated with saline solution.  The fascia of the vastus lateralis was closed with a Stratafix suture.  Then, the fascia lata closed over a Hemovac  drain with another Stratafix suture.  The subcu was closed in multiple layers with Stratafix and 2-0 and skin closed with staples.  The drain was hooked to suction, incision cleaned and dried, and a bulky sterile dressing applied.  He was placed into a  knee immobilizer, awakened and transported to recovery in stable condition.  LN/NUANCE  D:07/23/2018 T:07/24/2018 JOB:[id number]

## 2018-07-24 NOTE — Clinical Social Work Note (Addendum)
Clinical Social Work Assessment  Patient Details  Name: Phillip Grant MRN: [id number] Date of Birth: [birth year]  Date of referral:  07/24/18               Reason for consult:  Discharge Planning                Permission sought to share information with:  Family Supports Permission granted to share information::  Yes, Verbal Permission Granted  Name::        Agency::  SNF   Relationship:: Spouse    Contact Information:     Housing/Transportation Living arrangements for the past 2 months:  Skilled Nursing Facility Source of Information:  Patient Patient Interpreter Needed:  None Criminal Activity/Legal Involvement Pertinent to Current Situation/Hospitalization:  No - Comment as needed Significant Relationships:  Lives with:  Facility resident Do you feel safe going back to the place where you live?  Yes Need for family participation in patient care:  Yes (Comment)  Care giving concerns:   Patient is admitted for right total hip arthroplasty - posterior approach. PT recommends SNF  Social Worker assessment / plan:  CSW discussed discharge planning with patient. Patient agreeable to SNFrehab, Patient is long term care resident and will return [facility] to [facility].  Patient has been to rehab in past and understands the process.  FL2 complete.   Plan: SNF for rehab.   Employment status:  Retired Health and safety inspectornsurance information:  Medicare PT Recommendations:  Skilled Nursing Facility Information / Referral to community resources:  Skilled Nursing Facility  Patient/Family's Response to care:  Agreeable and Responding well to care.   Patient/Family's Understanding of and Emotional Response to Diagnosis, Current Treatment, and Prognosis:  Patient has a good understanding of his discharge plan to SNF.   Emotional Assessment Appearance:  Appears stated age Attitude/Demeanor/Rapport:    Affect (typically observed):  Accepting Orientation:  Oriented to Self, Oriented to  Place, Oriented to  Time, Oriented to Situation Alcohol / Substance use:  Not Applicable Psych involvement (Current and /or in the community):  No (Comment)  Discharge Needs  Concerns to be addressed:  Discharge Planning Concerns Readmission within the last 30 days:  No Current discharge risk:  Dependent with Mobility Barriers to Discharge:  Continued Medical Work up   [employer] A Phillip Watrous, LCSW 07/24/2018, 2:48 PM

## 2018-07-24 NOTE — [place] FL2 (Signed)
[place] MEDICAID FL2 LEVEL OF CARE SCREENING TOOL     IDENTIFICATION  Patient Name: Phillip Grant Birthdate: Jan 30, 1941 Sex: male Admission Date (Current Location): 07/23/2018  [facility] [place]   Facility and Address:  [facility],  [address], [place] [id number]      Provider Number: [id number]  Attending Physician Name and Address:  Ollen GrossAluisio, Frank, MD  Relative Name and Phone Number:       Current Level of Care: Hospital Recommended Level of Care: Skilled Nursing Facility Prior Approval Number:    Date Approved/Denied:   PASRR Number:    Discharge Plan: Home    Current Diagnoses: Patient Active Problem List   Diagnosis Date Noted  . OA (osteoarthritis) of hip 07/23/2018  . DM 06/16/2008  . HYPERLIPIDEMIA 06/16/2008  . OBSTRUCTIVE SLEEP APNEA 06/16/2008    Orientation RESPIRATION BLADDER Height & Weight     Self, Time, Situation, Place  Normal Continent Weight: 212 lb 15.4 oz (96.6 kg) Height:  5\' 7"  (170.2 cm)  BEHAVIORAL SYMPTOMS/MOOD NEUROLOGICAL BOWEL NUTRITION STATUS      Continent Diet(Low Sodium Heart Healthy )  AMBULATORY STATUS COMMUNICATION OF NEEDS Skin   Extensive Assist Verbally Surgical wounds(Right Hip)                       Personal Care Assistance Level of Assistance  Bathing, Feeding, Dressing Bathing Assistance: Limited assistance Feeding assistance: Independent Dressing Assistance: Limited assistance     Functional Limitations Info  Sight, Hearing, Speech Sight Info: Adequate Hearing Info: Adequate Speech Info: Adequate    SPECIAL CARE FACTORS FREQUENCY  PT (By licensed PT), OT (By licensed OT)     PT Frequency: 5x/week OT Frequency: 5x/week            Contractures Contractures Info: Not present    Additional Factors Info  Code Status, Allergies, Psychotropic, Insulin Sliding Scale Code Status Info: DNR Allergies Info: Acyclovir And Related, Macrobid Nitrofurantoin  Macrocrystal, Morphine And Related Psychotropic Info: Cymbalta         Current Medications (07/24/2018):  This is the current hospital active medication list Current Facility-Administered Medications  Medication Dose Route Frequency Provider Last Rate Last Dose  . 0.9 %  sodium chloride infusion   Intravenous Continuous Ollen GrossAluisio, Frank, MD 100 mL/hr at 07/23/18 1655    . acetaminophen (TYLENOL) tablet 325-650 mg  325-650 mg Oral Q6H PRN Ollen GrossAluisio, Frank, MD      . amLODipine (NORVASC) tablet 5 mg  5 mg Oral Daily Aluisio, Homero FellersFrank, MD      . atorvastatin (LIPITOR) tablet 10 mg  10 mg Oral Daily Aluisio, Homero FellersFrank, MD      . bisacodyl (DULCOLAX) suppository 10 mg  10 mg Rectal Daily PRN Ollen GrossAluisio, Frank, MD      . dexamethasone (DECADRON) injection 10 mg  10 mg Intravenous Once Ollen GrossAluisio, Frank, MD      . diphenhydrAMINE (BENADRYL) 12.5 MG/5ML elixir 12.5-25 mg  12.5-25 mg Oral Q4H PRN Aluisio, Homero FellersFrank, MD      . docusate sodium (COLACE) capsule 100 mg  100 mg Oral BID Ollen GrossAluisio, Frank, MD   100 mg at 07/23/18 2213  . DULoxetine (CYMBALTA) DR capsule 20 mg  20 mg Oral Daily Aluisio, Homero FellersFrank, MD      . fentaNYL (DURAGESIC - dosed mcg/hr) patch 25 mcg  25 mcg Transdermal Q72H Ollen GrossAluisio, Frank, MD   25 mcg at 07/23/18 1805  . glyBURIDE (DIABETA) tablet 2.5 mg  2.5 mg Oral Q breakfast Ollen Gross, MD   2.5 mg at 07/24/18 0807  . levothyroxine (SYNTHROID, LEVOTHROID) tablet 25 mcg  25 mcg Oral Q0600 Ollen Gross, MD   25 mcg at 07/24/18 0653  . menthol-cetylpyridinium (CEPACOL) lozenge 3 mg  1 lozenge Oral PRN Ollen Gross, MD       Or  . phenol (CHLORASEPTIC) mouth spray 1 spray  1 spray Mouth/Throat PRN Aluisio, Homero Fellers, MD      . methocarbamol (ROBAXIN) tablet 500 mg  500 mg Oral Q6H PRN Ollen Gross, MD   500 mg at 07/24/18 0200   Or  . methocarbamol (ROBAXIN) 500 mg in dextrose 5 % 50 mL IVPB  500 mg Intravenous Q6H PRN Ollen Gross, MD 100 mL/hr at 07/23/18 1543 500 mg at 07/23/18 1543  .  metoCLOPramide (REGLAN) tablet 5-10 mg  5-10 mg Oral Q8H PRN Aluisio, Homero Fellers, MD       Or  . metoCLOPramide (REGLAN) injection 5-10 mg  5-10 mg Intravenous Q8H PRN Aluisio, Homero Fellers, MD      . nitroGLYCERIN (NITROSTAT) SL tablet 0.4 mg  0.4 mg Sublingual Q5 min PRN Aluisio, Homero Fellers, MD      . ondansetron [facility]) tablet 4 mg  4 mg Oral Q6H PRN Ollen Gross, MD       Or  . ondansetron (ZOFRAN) injection 4 mg  4 mg Intravenous Q6H PRN Aluisio, Homero Fellers, MD      . oxyCODONE (Oxy IR/ROXICODONE) immediate release tablet 10 mg  10 mg Oral BID Ollen Gross, MD   10 mg at 07/23/18 2213  . oxyCODONE (Oxy IR/ROXICODONE) immediate release tablet 10-15 mg  10-15 mg Oral Q4H PRN Ollen Gross, MD   10 mg at 07/24/18 0831  . oxyCODONE (Oxy IR/ROXICODONE) immediate release tablet 5-10 mg  5-10 mg Oral Q4H PRN Ollen Gross, MD   10 mg at 07/24/18 0200  . polyethylene glycol (MIRALAX / GLYCOLAX) packet 17 g  17 g Oral Daily PRN Aluisio, Homero Fellers, MD      . rivaroxaban Carlena Hurl) tablet 10 mg  10 mg Oral Q breakfast Ollen Gross, MD   10 mg at 07/24/18 0806  . sodium phosphate (FLEET) 7-19 GM/118ML enema 1 enema  1 enema Rectal Once PRN Ollen Gross, MD         Discharge Medications: Please see discharge summary for a list of discharge medications.  Relevant Imaging Results:  Relevant Lab Results:   Additional Information SSN:[id number]  Clearance Coots, LCSW

## 2018-07-24 NOTE — Progress Notes (Signed)
Physical Therapy Treatment Patient Details Name: Phillip Grant MRN: [id number] DOB: 11-Aug-1941 Today's Date: 07/24/2018    History of Present Illness Pt s/p R THR and with hx of DM, neurogenic bladder, and s/p colostomy    PT Comments    Pt continues cooperative but requiring increased time for all tasks and progressing slowly 2* premorbid deconditioning and ltd WB tolerance of R LE and UEs.   Follow Up Recommendations  SNF     Equipment Recommendations  None recommended by PT    Recommendations for Other Services       Precautions / Restrictions Precautions Precautions: Fall Restrictions Weight Bearing Restrictions: No Other Position/Activity Restrictions: WBAT    Mobility  Bed Mobility Overal bed mobility: Needs Assistance Bed Mobility: Sit to Supine     Supine to sit: Max assist;+2 for physical assistance;+2 for safety/equipment Sit to supine: Total assist;+2 for physical assistance;+2 for safety/equipment   General bed mobility comments: Physical assist to manage LEs and to control trunk and to position pt in bed using bed pad  Transfers Overall transfer level: Needs assistance Equipment used: Standard walker Transfers: Sit to/from Stand Sit to Stand: Mod assist;Max assist;+2 physical assistance;+2 safety/equipment;From elevated surface Stand pivot transfers: Mod assist;+2 physical assistance;+2 safety/equipment       General transfer comment: physical assist to bring wt up and fwd and to balance in initial standing  Ambulation/Gait             General Gait Details: Pt unable to WB sufficiently on R LE and UEs to advance L LE;  Stand pvt recliner to bed with use of SW   Stairs             Wheelchair Mobility    Modified Rankin (Stroke Patients Only)       Balance Overall balance assessment: Needs assistance Sitting-balance support: Bilateral upper extremity supported;Feet supported Sitting balance-Leahy Scale: Fair     Standing  balance support: Bilateral upper extremity supported Standing balance-Leahy Scale: Poor                              Cognition Arousal/Alertness: Awake/alert Behavior During Therapy: WFL for tasks assessed/performed Overall Cognitive Status: Within Functional Limits for tasks assessed                                        Exercises Total Joint Exercises Ankle Circles/Pumps: AROM;Both;15 reps;Supine Quad Sets: AROM;Both;10 reps;Supine Heel Slides: AAROM;Right;15 reps;Supine Hip ABduction/ADduction: AAROM;Right;10 reps;Supine    General Comments        Pertinent Vitals/Pain Pain Assessment: 0-10 Pain Score: 3  Pain Location: R hip Pain Descriptors / Indicators: Aching;Sore Pain Intervention(s): Limited activity within patient's tolerance;Monitored during session;Premedicated before session;Ice applied    Home Living Family/patient expects to be discharged to:: Skilled nursing facility                    Prior Function Level of Independence: Needs assistance  Gait / Transfers Assistance Needed: Very ltd ambulation with SW   Comments: Pt is resident of [facility]   PT Goals (current goals can now be found in the care plan section) Acute Rehab PT Goals Patient Stated Goal: Decreased pain with ambulation/transfers PT Goal Formulation: With patient Time For Goal Achievement: 08/07/18 Potential to Achieve Goals: Fair Progress towards PT goals: Progressing toward goals  Frequency    Min 5X/week      PT Plan Current plan remains appropriate    Co-evaluation              AM-PAC PT "6 Clicks" Daily Activity  Outcome Measure  Difficulty turning over in bed (including adjusting bedclothes, sheets and blankets)?: Unable Difficulty moving from lying on back to sitting on the side of the bed? : Unable Difficulty sitting down on and standing up from a chair with arms (e.g., wheelchair, bedside commode, etc,.)?: Unable Help  needed moving to and from a bed to chair (including a wheelchair)?: A Lot Help needed walking in hospital room?: Total Help needed climbing 3-5 steps with a railing? : Total 6 Click Score: 7    End of Session Equipment Utilized During Treatment: Gait belt Activity Tolerance: Patient limited by fatigue Patient left: with call bell/phone within reach;in bed Nurse Communication: Mobility status PT Visit Diagnosis: Difficulty in walking, not elsewhere classified (R26.2)     Time: 1610-96041620-1655 PT Time Calculation (min) (ACUTE ONLY): 35 min  Charges:  $Therapeutic Exercise: 8-22 mins $Therapeutic Activity: 23-37 mins                     Phillip Grant PT Acute Rehabilitation Services Pager [phone] Office [phone]    Phillip Grant 07/24/2018, 5:34 PM

## 2018-07-24 NOTE — Progress Notes (Signed)
   Subjective: 1 Day Post-Op Procedure(s) (LRB): RIGHT TOTAL HIP ARTHROPLASTY-Posterior (Right) Patient reports pain as moderate.   Patient seen in rounds by Dr. Lequita HaltAluisio. Patient is well, and has had no acute complaints or problems other than pain in the right hip. Foley catheter in place. Denies chest pain or SOB. No issues overnight.  We will start therapy today.   Objective: Vital signs in last 24 hours: Temp:  [97.5 F (36.4 C)-99.5 F (37.5 C)] 98.9 F (37.2 C) (11/14 0550) Pulse Rate:  [90-107] 90 (11/14 0550) Resp:  [12-18] 14 (11/14 0550) BP: (92-161)/(49-82) 113/60 (11/14 0550) SpO2:  [92 %-99 %] 99 % (11/14 0550) Weight:  [96.6 kg] 96.6 kg (11/13 0933)  Intake/Output from previous day:  Intake/Output Summary (Last 24 hours) at 07/24/2018 0722 Last data filed at 07/24/2018 0600 Gross per 24 hour  Intake 6016.66 ml  Output 3315 ml  Net 2701.66 ml    Labs: Recent Labs    07/23/18 0941 07/23/18 1313 07/24/18 0422  HGB 13.7 10.9* 10.9*   Recent Labs    07/23/18 0941 07/23/18 1313 07/24/18 0422  WBC 11.1*  --  12.3*  RBC 5.23  --  4.01*  HCT 45.5 32.0* 35.8*  PLT 300  --  200   Recent Labs    07/23/18 0941 07/23/18 1313 07/24/18 0422  NA 136 138 139  K 4.8 5.1 5.5*  CL 103  --  108  CO2 25  --  25  BUN 41*  --  36*  CREATININE 1.50*  --  1.54*  GLUCOSE 144* 167* 161*  CALCIUM 8.7*  --  8.1*   Recent Labs    07/23/18 0941  INR 0.98   Exam: General - Patient is Alert and Oriented Extremity - Neurologically intact Neurovascular intact Sensation intact distally Dorsiflexion/Plantar flexion intact Dressing - dressing C/D/I Motor Function - intact, moving foot and toes well on exam.   Past Medical History:  Diagnosis Date  . Depression   . Diabetes mellitus without complication (HCC)    NIDDM   . GERD (gastroesophageal reflux disease)   . Hyperlipidemia   . Hypertension   . Neurogenic bladder   . Osteoarthritis      Assessment/Plan: 1 Day Post-Op Procedure(s) (LRB): RIGHT TOTAL HIP ARTHROPLASTY-Posterior (Right) Principal Problem:   OA (osteoarthritis) of hip  Estimated body mass index is 33.35 kg/m as calculated from the following:   Height as of this encounter: 5\' 7"  (1.702 m).   Weight as of this encounter: 96.6 kg. Advance diet Up with therapy  DVT Prophylaxis - Xarelto Weight bearing as tolerated D/C knee immobilizer Hemovac pulled without difficulty Begin therapy Hip precautions discussed with patient  Plan is to go to Skilled nursing facility after hospital stay. Plan for discharge tomorrow or Saturday pending bed availability.  Arther AbbottKristie Iyan Flett, PA-C Orthopedic Surgery 07/24/2018, 7:22 AM

## 2018-07-24 NOTE — Evaluation (Signed)
Physical Therapy Evaluation Patient Details Name: Phillip Grant MRN: [id number] DOB: [birth year] Today's Date: 07/24/2018   History of Present Illness  Pt s/p R THR and with hx of DM, neurogenic bladder, and s/p colostomy  Clinical Impression  Pt s/p R THR and presents with decreased R LE strength/ROM, post op pain, severe premorbid deconditioning/weakness; and posterior THP limiting functional mobility.  Pt will require follow up rehab at SNF level to maximize IND and safety.    Follow Up Recommendations SNF    Equipment Recommendations  None recommended by PT    Recommendations for Other Services       Precautions / Restrictions Precautions Precautions: Fall Restrictions Weight Bearing Restrictions: No Other Position/Activity Restrictions: WBAT      Mobility  Bed Mobility Overal bed mobility: Needs Assistance Bed Mobility: Supine to Sit     Supine to sit: Max assist;+2 for physical assistance;+2 for safety/equipment     General bed mobility comments: cues for use of L LE to self assist.  Physical assist to manage LEs, to control trunk and to complete transition to EOB sitting utilizing bed pad  Transfers Overall transfer level: Needs assistance Equipment used: Standard walker Transfers: Sit to/from Stand Sit to Stand: Mod assist;Max assist;+2 physical assistance;+2 safety/equipment;From elevated surface Stand pivot transfers: Mod assist;+2 physical assistance;+2 safety/equipment       General transfer comment: use of bed and physical assist to bring wt up and fwd and to balance in initial standing  Ambulation/Gait             General Gait Details: Pt unable to WB sufficiently on R LE and UEs to advance L LE;  Stand pvt bed to recliner with use of SW  Stairs            Wheelchair Mobility    Modified Rankin (Stroke Patients Only)       Balance Overall balance assessment: Needs assistance Sitting-balance support: Bilateral upper extremity  supported;Feet supported Sitting balance-Leahy Scale: Fair     Standing balance support: Bilateral upper extremity supported Standing balance-Leahy Scale: Poor                               Pertinent Vitals/Pain Pain Assessment: 0-10 Pain Score: 5  Pain Location: R hip Pain Descriptors / Indicators: Aching;Sore Pain Intervention(s): Limited activity within patient's tolerance;Monitored during session;Premedicated before session;Ice applied    Home Living Family/patient expects to be discharged to:: Skilled nursing facility                      Prior Function Level of Independence: Needs assistance   Gait / Transfers Assistance Needed: Very ltd ambulation with SW     Comments: Pt is resident of [facility]     Hand Dominance        Extremity/Trunk Assessment   Upper Extremity Assessment Upper Extremity Assessment: RUE deficits/detail;LUE deficits/detail;Generalized weakness RUE Deficits / Details: ltd range all jts all planes with noted significant arthritic changes in hands/fingers LUE Deficits / Details: as R UE    Lower Extremity Assessment Lower Extremity Assessment: RLE deficits/detail;LLE deficits/detail;Generalized weakness RLE Deficits / Details: 2-/5 strength at hip with AAROM at hip ltd to 30 degrees flex and 7 degrees abd    Cervical / Trunk Assessment Cervical / Trunk Assessment: Kyphotic  Communication   Communication: No difficulties  Cognition Arousal/Alertness: Awake/alert Behavior During Therapy: WFL for tasks assessed/performed Overall Cognitive Status:  Within Functional Limits for tasks assessed                                        General Comments      Exercises Total Joint Exercises Ankle Circles/Pumps: AROM;Both;15 reps;Supine Quad Sets: AROM;Both;10 reps;Supine Heel Slides: AAROM;Right;15 reps;Supine Hip ABduction/ADduction: AAROM;Right;10 reps;Supine   Assessment/Plan    PT Assessment  Patient needs continued PT services  PT Problem List Decreased strength;Decreased range of motion;Decreased activity tolerance;Decreased mobility;Decreased balance;Decreased knowledge of use of DME;Obesity;Pain;Decreased knowledge of precautions       PT Treatment Interventions DME instruction;Gait training;Stair training;Functional mobility training;Therapeutic activities;Therapeutic exercise;Patient/family education;Balance training    PT Goals (Current goals can be found in the Care Plan section)  Acute Rehab PT Goals Patient Stated Goal: Decreased pain with ambulation/transfers PT Goal Formulation: With patient Time For Goal Achievement: 08/07/18 Potential to Achieve Goals: Fair    Frequency Min 5X/week   Barriers to discharge        Co-evaluation               AM-PAC PT "6 Clicks" Daily Activity  Outcome Measure Difficulty turning over in bed (including adjusting bedclothes, sheets and blankets)?: Unable Difficulty moving from lying on back to sitting on the side of the bed? : Unable Difficulty sitting down on and standing up from a chair with arms (e.g., wheelchair, bedside commode, etc,.)?: Unable Help needed moving to and from a bed to chair (including a wheelchair)?: A Lot Help needed walking in hospital room?: Total Help needed climbing 3-5 steps with a railing? : Total 6 Click Score: 7    End of Session Equipment Utilized During Treatment: Gait belt Activity Tolerance: Patient limited by fatigue Patient left: in chair;with call bell/phone within reach Nurse Communication: Mobility status PT Visit Diagnosis: Difficulty in walking, not elsewhere classified (R26.2)    Time: [id number] PT Time Calculation (min) (ACUTE ONLY): 38 min   Charges:   PT Evaluation $PT Eval Low Complexity: 1 Low PT Treatments $Therapeutic Exercise: 8-22 mins $Therapeutic Activity: 8-22 mins        Mauro KaufmannHunter Niki Payment PT Acute Rehabilitation Services Pager [phone] Office  [phone]   Tylicia Sherman 07/24/2018, 5:26 PM

## 2018-07-25 DIAGNOSIS — E119 Type 2 diabetes mellitus without complications: Secondary | ICD-10-CM | POA: Diagnosis not present

## 2018-07-25 DIAGNOSIS — I959 Hypotension, unspecified: Secondary | ICD-10-CM | POA: Diagnosis not present

## 2018-07-25 DIAGNOSIS — G8929 Other chronic pain: Secondary | ICD-10-CM | POA: Diagnosis not present

## 2018-07-25 DIAGNOSIS — Z471 Aftercare following joint replacement surgery: Secondary | ICD-10-CM | POA: Diagnosis not present

## 2018-07-25 DIAGNOSIS — I10 Essential (primary) hypertension: Secondary | ICD-10-CM | POA: Diagnosis not present

## 2018-07-25 DIAGNOSIS — Z7401 Bed confinement status: Secondary | ICD-10-CM | POA: Diagnosis not present

## 2018-07-25 DIAGNOSIS — M1990 Unspecified osteoarthritis, unspecified site: Secondary | ICD-10-CM | POA: Diagnosis not present

## 2018-07-25 DIAGNOSIS — M2550 Pain in unspecified joint: Secondary | ICD-10-CM | POA: Diagnosis not present

## 2018-07-25 DIAGNOSIS — F411 Generalized anxiety disorder: Secondary | ICD-10-CM | POA: Diagnosis not present

## 2018-07-25 DIAGNOSIS — Z433 Encounter for attention to colostomy: Secondary | ICD-10-CM | POA: Diagnosis not present

## 2018-07-25 DIAGNOSIS — F339 Major depressive disorder, recurrent, unspecified: Secondary | ICD-10-CM | POA: Diagnosis not present

## 2018-07-25 DIAGNOSIS — G629 Polyneuropathy, unspecified: Secondary | ICD-10-CM | POA: Diagnosis not present

## 2018-07-25 DIAGNOSIS — G4700 Insomnia, unspecified: Secondary | ICD-10-CM | POA: Diagnosis not present

## 2018-07-25 DIAGNOSIS — K219 Gastro-esophageal reflux disease without esophagitis: Secondary | ICD-10-CM | POA: Diagnosis not present

## 2018-07-25 DIAGNOSIS — E785 Hyperlipidemia, unspecified: Secondary | ICD-10-CM | POA: Diagnosis not present

## 2018-07-25 DIAGNOSIS — N319 Neuromuscular dysfunction of bladder, unspecified: Secondary | ICD-10-CM | POA: Diagnosis not present

## 2018-07-25 DIAGNOSIS — R627 Adult failure to thrive: Secondary | ICD-10-CM | POA: Diagnosis not present

## 2018-07-25 DIAGNOSIS — Z96641 Presence of right artificial hip joint: Secondary | ICD-10-CM | POA: Diagnosis not present

## 2018-07-25 DIAGNOSIS — F329 Major depressive disorder, single episode, unspecified: Secondary | ICD-10-CM | POA: Diagnosis not present

## 2018-07-25 DIAGNOSIS — Z436 Encounter for attention to other artificial openings of urinary tract: Secondary | ICD-10-CM | POA: Diagnosis not present

## 2018-07-25 DIAGNOSIS — R339 Retention of urine, unspecified: Secondary | ICD-10-CM | POA: Diagnosis not present

## 2018-07-25 LAB — CBC
HCT: 31.6 % — ABNORMAL LOW (ref 39.0–52.0)
Hemoglobin: 9.7 g/dL — ABNORMAL LOW (ref 13.0–17.0)
MCH: 27.2 pg (ref 26.0–34.0)
MCHC: 30.7 g/dL (ref 30.0–36.0)
MCV: 88.8 fL (ref 80.0–100.0)
PLATELETS: 207 10*3/uL (ref 150–400)
RBC: 3.56 MIL/uL — ABNORMAL LOW (ref 4.22–5.81)
RDW: 15.1 % (ref 11.5–15.5)
WBC: 14.5 10*3/uL — ABNORMAL HIGH (ref 4.0–10.5)
nRBC: 0 % (ref 0.0–0.2)

## 2018-07-25 LAB — BASIC METABOLIC PANEL
Anion gap: 6 (ref 5–15)
BUN: 43 mg/dL — AB (ref 8–23)
CO2: 24 mmol/L (ref 22–32)
CREATININE: 1.64 mg/dL — AB (ref 0.61–1.24)
Calcium: 8.4 mg/dL — ABNORMAL LOW (ref 8.9–10.3)
Chloride: 108 mmol/L (ref 98–111)
GFR calc Af Amer: 45 mL/min — ABNORMAL LOW (ref >60)
GFR, EST NON AFRICAN AMERICAN: 39 mL/min — AB (ref >60)
Glucose, Bld: 133 mg/dL — ABNORMAL HIGH (ref 70–99)
Potassium: 5.4 mmol/L — ABNORMAL HIGH (ref 3.5–5.1)
SODIUM: 138 mmol/L (ref 135–145)

## 2018-07-25 MED ORDER — SODIUM POLYSTYRENE SULFONATE 15 GM/60ML PO SUSP
15.0000 g | Freq: Once | ORAL | Status: AC
  Administered 2018-07-25: 15 g via ORAL
  Filled 2018-07-25 (×2): qty 60

## 2018-07-25 NOTE — Progress Notes (Signed)
Called report to Lindsay,RN [facility] to come at 12:00pm. IV removed. Reviewed discharge information, instructions and prescriptions. Will continue to monitor.

## 2018-07-25 NOTE — Progress Notes (Signed)
[facility] under Bundle program/ [facility]. Patient belonging picked up by [facility] staff. Nurse given the number to call report. PT[facility] arranged to transport the patient.   Vivi BarrackNicole Ariaunna Longsworth, Alexander MtLCSW, MSW Clinical Social Worker  [phone] 07/25/2018  12:17 PM

## 2018-07-25 NOTE — Discharge Summary (Signed)
Physician Discharge Summary   Patient ID: QUANDARIUS NILL MRN: [id number] DOB/AGE: 03/04/1941 77 y.o.  Admit date: 07/23/2018 Discharge date: 07/25/2018  Primary Diagnosis: Severe osteoarthritis, right hip   Admission Diagnoses:  Past Medical History:  Diagnosis Date  . Depression   . Diabetes mellitus without complication (HCC)    NIDDM   . GERD (gastroesophageal reflux disease)   . Hyperlipidemia   . Hypertension   . Neurogenic bladder   . Osteoarthritis    Discharge Diagnoses:   Principal Problem:   OA (osteoarthritis) of hip  Estimated body mass index is 33.35 kg/m as calculated from the following:   Height as of this encounter: '5\' 7"'  (1.702 m).   Weight as of this encounter: 96.6 kg.  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  He has severe pain with this and is requiring fentanyl patches.  We discussed a total hip arthroplasty in an attempt to help with his pain without any definitive functional improvement given his current functional status.  He presents today for a total hip arthroplasty.  We decided  to do a posterior approach given his acetabular bone loss.  Laboratory Data: Admission on 07/23/2018  Component Date Value Ref Range Status  . WBC 07/23/2018 11.1* 4.0 - 10.5 K/uL Final  . RBC 07/23/2018 5.23  4.22 - 5.81 MIL/uL Final  . Hemoglobin 07/23/2018 13.7  13.0 - 17.0 g/dL Final  . HCT 07/23/2018 45.5  39.0 - 52.0 % Final  . MCV 07/23/2018 87.0  80.0 - 100.0 fL Final  . MCH 07/23/2018 26.2  26.0 - 34.0 pg Final  . MCHC 07/23/2018 30.1  30.0 - 36.0 g/dL Final  . RDW 07/23/2018 14.9  11.5 - 15.5 % Final  . Platelets 07/23/2018 300  150 - 400 K/uL Final  . nRBC 07/23/2018 0.0  0.0 - 0.2 % Final     Performed at [facility], [place] [address] [place], [place] [address]  . Sodium 07/23/2018 136  135 - 145 mmol/L Final  . Potassium 07/23/2018 4.8  3.5 - 5.1 mmol/L Final  . Chloride 07/23/2018 103  98 - 111 mmol/L Final  . CO2 07/23/2018 25  22 - 32 mmol/L Final  . Glucose, Bld 07/23/2018 144* 70 - 99 mg/dL Final  . BUN 07/23/2018 41* 8 - 23 mg/dL Final  . Creatinine, Ser 07/23/2018 1.50* 0.61 - 1.24 mg/dL Final  . Calcium 07/23/2018 8.7* 8.9 - 10.3 mg/dL Final  . Total Protein 07/23/2018 8.4* 6.5 - 8.1 g/dL Final  . Albumin 07/23/2018 3.6  3.5 - 5.0 g/dL Final  . AST 07/23/2018 17  15 - 41 U/L Final  . ALT 07/23/2018 15  0 - 44 U/L Final  . Alkaline Phosphatase 07/23/2018 153* 38 - 126 U/L Final  . Total Bilirubin 07/23/2018 0.6  0.3 - 1.2 mg/dL Final  . GFR calc non Af Amer 07/23/2018 43* >60 mL/min Final  . GFR calc Af Amer 07/23/2018 50* >60 mL/min Final   Comment: (NOTE) The eGFR has been calculated using the CKD EPI equation. This calculation has not been validated in all clinical situations. eGFR's persistently <60 mL/min signify possible Chronic Kidney Disease.   . Anion gap 07/23/2018 8  5 - 15 Final   Performed  at [facility], [place] [address], [place], [place] [address]  . Prothrombin Time 07/23/2018 12.9  11.4 - 15.2 seconds Final  . INR 07/23/2018 0.98   Final   Performed at [facility], [place] [address] [place], [place] [address]  . aPTT 07/23/2018 33  24 - 36 seconds Final   Performed at [facility], [place] [address] [place], [place] [address]  . ABO/RH(D) 07/23/2018 A POS   Final  . Antibody Screen 07/23/2018 NEG   Final  . Sample Expiration 07/23/2018 07/26/2018   Final  . Unit Number 07/23/2018 [id number]   Final  . Blood Component Type 07/23/2018 RED CELLS,LR   Final  . Unit division 07/23/2018 00   Final  . Status of Unit 07/23/2018 ISSUED,FINAL   Final  . Transfusion Status  07/23/2018 OK TO TRANSFUSE   Final  . Crossmatch Result 07/23/2018    Final                   Value:Compatible Performed at [facility], [place] [address], [place], [place] [address]   . Unit Number 07/23/2018 [id number]   Final  . Blood Component Type 07/23/2018 RED CELLS,LR   Final  . Unit division 07/23/2018 00   Final  . Status of Unit 07/23/2018 ISSUED,FINAL   Final  . Transfusion Status 07/23/2018 OK TO TRANSFUSE   Final  . Crossmatch Result 07/23/2018 Compatible   Final  . MRSA, PCR 07/23/2018 NEGATIVE  NEGATIVE Final  . Staphylococcus aureus 07/23/2018 POSITIVE* NEGATIVE Final   Comment: (NOTE) The Xpert SA Assay (FDA approved for NASAL specimens in patients 42 years of age and older), is one component of a comprehensive surveillance program. It is not intended to diagnose infection nor to guide or monitor treatment. Performed at [facility], [place] [address] [place], [place] [address]   . Glucose-Capillary 07/23/2018 128* 70 - 99 mg/dL Final  . Comment 1 07/23/2018 Notify RN   Final  . Comment 2 07/23/2018 Document in Chart   Final  . ABO/RH(D) 07/23/2018    Final                   Value:A POS Performed at [facility], [place] [address], [place],  [address]   . Order Confirmation 07/23/2018    Final                   Value:ORDER PROCESSED BY BLOOD BANK Performed at [facility], [place] [address] [place],  [address]   . ISSUE DATE / TIME 07/23/2018 [id number]   Final  . Blood Product Unit Number 07/23/2018 [id number]   Final  . PRODUCT CODE 07/23/2018 [id number]   Final  . Unit Type and Rh 07/23/2018 6200   Final  . Blood Product Expiration Date 07/23/2018 [id number]   Final  . ISSUE DATE / TIME 07/23/2018 [id number]   Final  . Blood Product Unit Number 07/23/2018 [id number]   Final  . PRODUCT CODE 07/23/2018 [id number]   Final  . Unit Type and Rh 07/23/2018 6200   Final  .  Blood Product Expiration Date 07/23/2018 [id number]   Final  . Glucose-Capillary 07/23/2018 169* 70 - 99 mg/dL Final  . Comment 1 07/23/2018 Document in Chart   Final  . WBC 07/24/2018 12.3* 4.0 - 10.5 K/uL Final  . RBC 07/24/2018 4.01* 4.22 - 5.81 MIL/uL Final  . Hemoglobin 07/24/2018 10.9* 13.0 - 17.0  g/dL Final  . HCT 07/24/2018 35.8* 39.0 - 52.0 % Final  . MCV 07/24/2018 89.3  80.0 - 100.0 fL Final  . MCH 07/24/2018 27.2  26.0 - 34.0 pg Final  . MCHC 07/24/2018 30.4  30.0 - 36.0 g/dL Final  . RDW 07/24/2018 15.0  11.5 - 15.5 % Final  . Platelets 07/24/2018 200  150 - 400 K/uL Final  . nRBC 07/24/2018 0.0  0.0 - 0.2 % Final   Performed at [facility], [place] [address] [place], [place] [address]  . Sodium 07/24/2018 139  135 - 145 mmol/L Final  . Potassium 07/24/2018 5.5* 3.5 - 5.1 mmol/L Final  . Chloride 07/24/2018 108  98 - 111 mmol/L Final  . CO2 07/24/2018 25  22 - 32 mmol/L Final  . Glucose, Bld 07/24/2018 161* 70 - 99 mg/dL Final  . BUN 07/24/2018 36* 8 - 23 mg/dL Final  . Creatinine, Ser 07/24/2018 1.54* 0.61 - 1.24 mg/dL Final  . Calcium 07/24/2018 8.1* 8.9 - 10.3 mg/dL Final  . GFR calc non Af Amer 07/24/2018 42* >60 mL/min Final  . GFR calc Af Amer 07/24/2018 49* >60 mL/min Final   Comment: (NOTE) The eGFR has been calculated using the CKD EPI equation. This calculation has not been validated in all clinical situations. eGFR's persistently <60 mL/min signify possible Chronic Kidney Disease.   Georgiann Hahn gap 07/24/2018 6  5 - 15 Final   Performed at [facility], [place] [address], [place], [place] [address]  . Sodium 07/23/2018 138  135 - 145 mmol/L Final  . Potassium 07/23/2018 5.1  3.5 - 5.1 mmol/L Final  . Glucose, Bld 07/23/2018 167* 70 - 99 mg/dL Final  . HCT 07/23/2018 32.0* 39.0 - 52.0 % Final  . Hemoglobin 07/23/2018 10.9* 13.0 - 17.0 g/dL Final  . Glucose-Capillary 07/23/2018 174* 70 - 99 mg/dL Final  . WBC 07/25/2018 14.5*  4.0 - 10.5 K/uL Final  . RBC 07/25/2018 3.56* 4.22 - 5.81 MIL/uL Final  . Hemoglobin 07/25/2018 9.7* 13.0 - 17.0 g/dL Final  . HCT 07/25/2018 31.6* 39.0 - 52.0 % Final  . MCV 07/25/2018 88.8  80.0 - 100.0 fL Final  . MCH 07/25/2018 27.2  26.0 - 34.0 pg Final  . MCHC 07/25/2018 30.7  30.0 - 36.0 g/dL Final  . RDW 07/25/2018 15.1  11.5 - 15.5 % Final  . Platelets 07/25/2018 207  150 - 400 K/uL Final  . nRBC 07/25/2018 0.0  0.0 - 0.2 % Final   Performed at [facility], [place] [address], [place], [place] [address]  . Sodium 07/25/2018 138  135 - 145 mmol/L Final  . Potassium 07/25/2018 5.4* 3.5 - 5.1 mmol/L Final  . Chloride 07/25/2018 108  98 - 111 mmol/L Final  . CO2 07/25/2018 24  22 - 32 mmol/L Final  . Glucose, Bld 07/25/2018 133* 70 - 99 mg/dL Final  . BUN 07/25/2018 43* 8 - 23 mg/dL Final  . Creatinine, Ser 07/25/2018 1.64* 0.61 - 1.24 mg/dL Final  . Calcium 07/25/2018 8.4* 8.9 - 10.3 mg/dL Final  . GFR calc non Af Amer 07/25/2018 39* >60 mL/min Final  . GFR calc Af Amer 07/25/2018 45* >60 mL/min Final   Comment: (NOTE) The eGFR has been calculated using the CKD EPI equation. This calculation has not been validated in all clinical situations. eGFR's persistently <60 mL/min signify possible Chronic Kidney Disease.   . Anion gap 07/25/2018 6  5 - 15 Final  Performed at [facility], [place] [address], [place], [place] [address]     X-Rays:Dg Pelvis Portable  Result Date: 07/23/2018 CLINICAL DATA:  Status post right hip arthroplasty. EXAM: PORTABLE PELVIS 1-2 VIEWS COMPARISON:  None. FINDINGS: Two submitted images show placement of a right total hip arthroplasty. The acetabular component is situated along the superior margin of a dysplastic right acetabulum. The fixation screw is well seated. The head portion of the femoral component projects well aligned with the acetabular component. The intramedullary stem of the femoral component appears well  seated. No acute fracture or evidence of an operative complication. IMPRESSION: Well-aligned total right hip arthroplasty described. Electronically Signed   By: Lajean Manes M.D.   On: 07/23/2018 16:21   Dg Hip Operative Unilat W Or W/o Pelvis Right  Result Date: 07/23/2018 CLINICAL DATA:  Right hip prosthesis EXAM: OPERATIVE RIGHT HIP (WITH PELVIS IF PERFORMED) 2 VIEWS TECHNIQUE: Fluoroscopic spot image(s) were submitted for interpretation post-operatively. COMPARISON:  None. FINDINGS: Proximal view of the femur shows the right hip prosthesis within the proximal femoral shaft. A second film shows prosthesis identified extending beyond the cortex of the femur. Correlation with the operative findings is recommended. IMPRESSION: Proximal view of the femur demonstrates the femoral prosthesis in place. A more lateral projection suggests that the prosthesis extends beyond the femoral cortex. Correlation with the operative findings is recommended. More thorough imaging may be helpful as well. These results will be called to the ordering clinician or representative by the Radiologist Assistant, and communication documented in the PACS or zVision Dashboard. Electronically Signed   By: Inez Catalina M.D.   On: 07/23/2018 14:14    EKG: Orders placed or performed during the hospital encounter of 07/23/18  . EKG 12 lead  . EKG 12 lead  . EKG 12-Lead  . EKG 12-Lead     Hospital Course: DOAK MAH is a 77 y.o. who was admitted to [facility]. They were brought to the operating room on 07/23/2018 and underwent Procedure(s): RIGHT TOTAL HIP ARTHROPLASTY-Posterior.  Patient tolerated the procedure well and was later transferred to the recovery room and then to the orthopaedic floor for postoperative care. They were given PO and IV analgesics for pain control following their surgery. They were given 24 hours of postoperative antibiotics of  Anti-infectives (From admission, onward)   Start      Dose/Rate Route Frequency Ordered Stop   07/23/18 1800  ceFAZolin (ANCEF) IVPB 2g/100 mL premix     2 g 200 mL/hr over 30 Minutes Intravenous Every 6 hours 07/23/18 1645 07/23/18 2333   07/23/18 0600  ceFAZolin (ANCEF) 3 g in dextrose 5 % 50 mL IVPB     3 g 100 mL/hr over 30 Minutes Intravenous 30 min pre-op 07/22/18 1016 07/23/18 1155     and started on DVT prophylaxis in the form of Xarelto.   PT and OT were ordered for total joint protocol. Discharge planning consulted to help with postop disposition and equipment needs. Patient had a decent night on the evening of surgery. They started to get up OOB with therapy on POD #1. Hemovac drain was pulled without difficulty on day one and knee immobilizer was discontinued. Continued to work with therapy into POD #2. Pt was seen during rounds on day two and was ready for discharge pending bed availability. Dressing was changed and the incision was clean, dry, and intact with no drainage. Pt worked with therapy on day two and was later discharged  to SNF in stable condition.   Diet: Diabetic diet Activity: WBAT. No flexion > 90 degrees, crossing of the right leg, or excessive rotation x 6 weeks. Hip precautions discussed with patient.  Follow-up: in 2 weeks Disposition: Skilled nursing facility Discharged Condition: stable   Discharge Instructions    Call MD / Call 911   Complete by:  As directed    If you experience chest pain or shortness of breath, CALL 911 and be transported to the hospital emergency room.  If you develope a fever above 101 F, pus (white drainage) or increased drainage or redness at the wound, or calf pain, call your surgeon's office.   Change dressing   Complete by:  As directed    Change the dressing daily with sterile 4 x 4 inch gauze dressing and paper tape.   Constipation Prevention   Complete by:  As directed    Drink plenty of fluids.  Prune juice may be helpful.  You may use a stool softener, such as Colace (over the  counter) 100 mg twice a day.  Use MiraLax (over the counter) for constipation as needed.   Diet - low sodium heart healthy   Complete by:  As directed    Discharge instructions   Complete by:  As directed    Dr. Gaynelle Arabian Total Joint Specialist [facility] [address], [place], [place] [address] [phone]  POSTERIOR TOTAL HIP REPLACEMENT POSTOPERATIVE DIRECTIONS  Hip Rehabilitation, Guidelines Following Surgery  The results of a hip operation are greatly improved after range of motion and muscle strengthening exercises. Follow all safety measures which are given to protect your hip. If any of these exercises cause increased pain or swelling in your joint, decrease the amount until you are comfortable again. Then slowly increase the exercises. Call your caregiver if you have problems or questions.   HOME CARE INSTRUCTIONS  Remove items at home which could result in a fall. This includes throw rugs or furniture in walking pathways.  ICE to the affected hip every three hours for 30 minutes at a time and then as needed for pain and swelling.  Continue to use ice on the hip for pain and swelling from surgery. You may notice swelling that will progress down to the foot and ankle.  This is normal after surgery.  Elevate the leg when you are not up walking on it.   Continue to use the breathing machine which will help keep your temperature down.  It is common for your temperature to cycle up and down following surgery, especially at night when you are not up moving around and exerting yourself.  The breathing machine keeps your lungs expanded and your temperature down.  DIET You may resume your previous home diet once your are discharged from the hospital.  DRESSING / WOUND CARE / SHOWERING You may shower 3 days after surgery, but keep the wounds dry during showering.  You may use an occlusive plastic wrap (Press'n Seal for example), NO SOAKING/SUBMERGING IN THE BATHTUB.  If  the bandage gets wet, change with a clean dry gauze.  If the incision gets wet, pat the wound dry with a clean towel. You may start showering once you are discharged home but do not submerge the incision under water. Just pat the incision dry and apply a dry gauze dressing on daily. Change the surgical dressing daily and reapply a dry dressing each time.  ACTIVITY Walk with your walker as instructed. Use walker  as long as suggested by your caregivers. Avoid periods of inactivity such as sitting longer than an hour when not asleep. This helps prevent blood clots.  You may resume a sexual relationship in one month or when given the OK by your doctor.  You may return to work once you are cleared by your doctor.  Do not drive a car for 6 weeks or until released by you surgeon.  Do not drive while taking narcotics.  WEIGHT BEARING Weight bearing as tolerated with assist device (walker, cane, etc) as directed, use it as long as suggested by your surgeon or therapist, typically at least 4-6 weeks.  POSTOPERATIVE CONSTIPATION PROTOCOL Constipation - defined medically as fewer than three stools per week and severe constipation as less than one stool per week.  One of the most common issues patients have following surgery is constipation.  Even if you have a regular bowel pattern at home, your normal regimen is likely to be disrupted due to multiple reasons following surgery.  Combination of anesthesia, postoperative narcotics, change in appetite and fluid intake all can affect your bowels.  In order to avoid complications following surgery, here are some recommendations in order to help you during your recovery period.  Colace (docusate) - Pick up an over-the-counter form of Colace or another stool softener and take twice a day as long as you are requiring postoperative pain medications.  Take with a full glass of water daily.  If you experience loose stools or diarrhea, hold the colace until you stool  forms back up.  If your symptoms do not get better within 1 week or if they get worse, check with your doctor.  Dulcolax (bisacodyl) - Pick up over-the-counter and take as directed by the product packaging as needed to assist with the movement of your bowels.  Take with a full glass of water.  Use this product as needed if not relieved by Colace only.   MiraLax (polyethylene glycol) - Pick up over-the-counter to have on hand.  MiraLax is a solution that will increase the amount of water in your bowels to assist with bowel movements.  Take as directed and can mix with a glass of water, juice, soda, coffee, or tea.  Take if you go more than two days without a movement. Do not use MiraLax more than once per day. Call your doctor if you are still constipated or irregular after using this medication for 7 days in a row.  If you continue to have problems with postoperative constipation, please contact the office for further assistance and recommendations.  If you experience "the worst abdominal pain ever" or develop nausea or vomiting, please contact the office immediatly for further recommendations for treatment.  ITCHING  If you experience itching with your medications, try taking only a single pain pill, or even half a pain pill at a time.  You can also use Benadryl over the counter for itching or also to help with sleep.   TED HOSE STOCKINGS Wear the elastic stockings on both legs for three weeks following surgery during the day but you may remove then at night for sleeping.  MEDICATIONS See your medication summary on the "After Visit Summary" that the nursing staff will review with you prior to discharge.  You may have some home medications which will be placed on hold until you complete the course of blood thinner medication.  It is important for you to complete the blood thinner medication as prescribed by your surgeon.  Continue your approved medications as instructed at time of  discharge.  PRECAUTIONS If you experience chest pain or shortness of breath - call 911 immediately for transfer to the hospital emergency department.  If you develop a fever greater that 101 F, purulent drainage from wound, increased redness or drainage from wound, foul odor from the wound/dressing, or calf pain - CONTACT YOUR SURGEON.                                                   FOLLOW-UP APPOINTMENTS Make sure you keep all of your appointments after your operation with your surgeon and caregivers. You should call the office at the above phone number and make an appointment for approximately two weeks after the date of your surgery or on the date instructed by your surgeon outlined in the "After Visit Summary".  RANGE OF MOTION AND STRENGTHENING EXERCISES  These exercises are designed to help you keep full movement of your hip joint. Follow your caregiver's or physical therapist's instructions. Perform all exercises about fifteen times, three times per day or as directed. Exercise both hips, even if you have had only one joint replacement. These exercises can be done on a training (exercise) mat, on the floor, on a table or on a bed. Use whatever works the best and is most comfortable for you. Use music or television while you are exercising so that the exercises are a pleasant break in your day. This will make your life better with the exercises acting as a break in routine you can look forward to.  Lying on your back, slowly slide your foot toward your buttocks, raising your knee up off the floor. Then slowly slide your foot back down until your leg is straight again.  Lying on your back spread your legs as far apart as you can without causing discomfort.  Lying on your side, raise your upper leg and foot straight up from the floor as far as is comfortable. Slowly lower the leg and repeat.  Lying on your back, tighten up the muscle in the front of your thigh (quadriceps muscles). You can do  this by keeping your leg straight and trying to raise your heel off the floor. This helps strengthen the largest muscle supporting your knee.  Lying on your back, tighten up the muscles of your buttocks both with the legs straight and with the knee bent at a comfortable angle while keeping your heel on the floor.   IF YOU ARE TRANSFERRED TO A SKILLED REHAB FACILITY If the patient is transferred to a skilled rehab facility following release from the hospital, a list of the current medications will be sent to the facility for the patient to continue.  When discharged from the skilled rehab facility, please have the facility set up the patient's [place] prior to being released. Also, the skilled facility will be responsible for providing the patient with their medications at time of release from the facility to include their pain medication, the muscle relaxants, and their blood thinner medication. If the patient is still at the rehab facility at time of the two week follow up appointment, the skilled rehab facility will also need to assist the patient in arranging follow up appointment in our office and any transportation needs.  MAKE SURE YOU:  Understand these instructions.  Get help right away if you are not doing well or get worse.    Pick up stool softner and laxative for home use following surgery while on pain medications. Do not submerge incision under water. Please use good hand washing techniques while changing dressing each day. May shower starting three days after surgery. Please use a clean towel to pat the incision dry following showers. Continue to use ice for pain and swelling after surgery. Do not use any lotions or creams on the incision until instructed by your surgeon.   Do not sit on low chairs, stoools or toilet seats, as it may be difficult to get up from low surfaces   Complete by:  As directed    Driving restrictions   Complete by:  As directed    No  driving for two weeks   TED hose   Complete by:  As directed    Use stockings (TED hose) for three weeks on both leg(s).  You may remove them at night for sleeping.   Weight bearing as tolerated   Complete by:  As directed      Allergies as of 07/25/2018      Reactions   Acyclovir And Related    Macrobid [nitrofurantoin Macrocrystal]    Documented on MAR   Morphine And Related    Documented on MAR      Medication List    STOP taking these medications   CRANBERRY-VITAMIN C PO     TAKE these medications   amLODipine 5 MG tablet Commonly known as:  NORVASC Take 5 mg by mouth daily.   atorvastatin 10 MG tablet Commonly known as:  LIPITOR Take 10 mg by mouth daily.   bisacodyl 10 MG suppository Commonly known as:  DULCOLAX Place 1 suppository (10 mg total) rectally daily as needed for moderate constipation.   CALMOSEPTINE 0.44-20.6 % Oint Generic drug:  Menthol-Zinc Oxide Apply 1 application topically daily. Apply to sacrum and buttocks   docusate sodium 100 MG capsule Commonly known as:  COLACE Take 1 capsule (100 mg total) by mouth 2 (two) times daily.   DULoxetine 20 MG capsule Commonly known as:  CYMBALTA Take 20 mg by mouth daily.   fentaNYL 25 MCG/HR patch Commonly known as:  DURAGESIC - dosed mcg/hr Place 25 mcg onto the skin every 3 (three) days.   glyBURIDE 2.5 MG tablet Commonly known as:  DIABETA Take 2.5 mg by mouth daily.   levothyroxine 25 MCG tablet Commonly known as:  SYNTHROID, LEVOTHROID Take 25 mcg by mouth daily at 2 PM.   methocarbamol 500 MG tablet Commonly known as:  ROBAXIN Take 1 tablet (500 mg total) by mouth every 6 (six) hours as needed for muscle spasms.   nitroGLYCERIN 0.4 MG SL tablet Commonly known as:  NITROSTAT Place 0.4 mg under the tongue every 5 (five) minutes as needed for chest pain.   ondansetron 4 MG tablet Commonly known as:  ZOFRAN Take 1 tablet (4 mg total) by mouth every 6 (six) hours as needed for nausea.    oxyCODONE 5 MG immediate release tablet Commonly known as:  Oxy IR/ROXICODONE Take 1-2 tablets (5-10 mg total) by mouth every 6 (six) hours as needed for moderate pain (pain score 4-6). What changed:    medication strength  how much to take  when to take this  reasons to take this   polyethylene glycol packet Commonly known as:  MIRALAX / GLYCOLAX Take 17 g by mouth daily as needed for  mild constipation.   rivaroxaban 10 MG Tabs tablet Commonly known as:  XARELTO Take 1 tablet (10 mg total) by mouth daily with breakfast. Take one 10 mg xarelto once a day for three weeks following surgery. Then take one 81 mg aspirin once a day for three weeks. Then discontinue aspirin.            Discharge Care Instructions  (From admission, onward)         Start     Ordered   07/24/18 0000  Weight bearing as tolerated     07/24/18 0729   07/24/18 0000  Change dressing    Comments:  Change the dressing daily with sterile 4 x 4 inch gauze dressing and paper tape.   07/24/18 0729          Contact information for follow-up providers    Gaynelle Arabian, MD. Schedule an appointment as soon as possible for a visit on 08/05/2018.   Specialty:  Orthopedic Surgery Contact information: [address] [place] [address] [phone]            Contact information for after-discharge care    Destination    [facility] .   Service:  Skilled Nursing Contact information: [address] [place] [phone]                  Signed: Theresa Duty, PA-C Orthopedic Surgery 07/25/2018, 8:09 AM

## 2018-07-25 NOTE — Progress Notes (Signed)
[employer] transportation came and took patients personal wheelchair, walker and belongings.

## 2018-08-05 DIAGNOSIS — F339 Major depressive disorder, recurrent, unspecified: Secondary | ICD-10-CM | POA: Diagnosis not present

## 2018-08-05 DIAGNOSIS — F411 Generalized anxiety disorder: Secondary | ICD-10-CM | POA: Diagnosis not present

## 2018-08-15 DIAGNOSIS — R2689 Other abnormalities of gait and mobility: Secondary | ICD-10-CM | POA: Diagnosis not present

## 2018-08-15 DIAGNOSIS — Z471 Aftercare following joint replacement surgery: Secondary | ICD-10-CM | POA: Diagnosis not present

## 2018-08-15 DIAGNOSIS — R262 Difficulty in walking, not elsewhere classified: Secondary | ICD-10-CM | POA: Diagnosis not present

## 2018-08-15 DIAGNOSIS — M6281 Muscle weakness (generalized): Secondary | ICD-10-CM | POA: Diagnosis not present

## 2018-08-18 DIAGNOSIS — M1611 Unilateral primary osteoarthritis, right hip: Secondary | ICD-10-CM | POA: Diagnosis not present

## 2018-08-18 DIAGNOSIS — N183 Chronic kidney disease, stage 3 (moderate): Secondary | ICD-10-CM | POA: Diagnosis not present

## 2018-08-18 DIAGNOSIS — R2689 Other abnormalities of gait and mobility: Secondary | ICD-10-CM | POA: Diagnosis not present

## 2018-08-18 DIAGNOSIS — F339 Major depressive disorder, recurrent, unspecified: Secondary | ICD-10-CM | POA: Diagnosis not present

## 2018-08-18 DIAGNOSIS — E039 Hypothyroidism, unspecified: Secondary | ICD-10-CM | POA: Diagnosis not present

## 2018-08-18 DIAGNOSIS — Z471 Aftercare following joint replacement surgery: Secondary | ICD-10-CM | POA: Diagnosis not present

## 2018-08-18 DIAGNOSIS — I129 Hypertensive chronic kidney disease with stage 1 through stage 4 chronic kidney disease, or unspecified chronic kidney disease: Secondary | ICD-10-CM | POA: Diagnosis not present

## 2018-08-18 DIAGNOSIS — M6281 Muscle weakness (generalized): Secondary | ICD-10-CM | POA: Diagnosis not present

## 2018-08-18 DIAGNOSIS — R262 Difficulty in walking, not elsewhere classified: Secondary | ICD-10-CM | POA: Diagnosis not present

## 2018-08-19 DIAGNOSIS — F411 Generalized anxiety disorder: Secondary | ICD-10-CM | POA: Diagnosis not present

## 2018-08-19 DIAGNOSIS — F339 Major depressive disorder, recurrent, unspecified: Secondary | ICD-10-CM | POA: Diagnosis not present

## 2018-08-20 DIAGNOSIS — Z471 Aftercare following joint replacement surgery: Secondary | ICD-10-CM | POA: Diagnosis not present

## 2018-08-20 DIAGNOSIS — R2689 Other abnormalities of gait and mobility: Secondary | ICD-10-CM | POA: Diagnosis not present

## 2018-08-20 DIAGNOSIS — R262 Difficulty in walking, not elsewhere classified: Secondary | ICD-10-CM | POA: Diagnosis not present

## 2018-08-20 DIAGNOSIS — M6281 Muscle weakness (generalized): Secondary | ICD-10-CM | POA: Diagnosis not present

## 2018-08-22 DIAGNOSIS — R262 Difficulty in walking, not elsewhere classified: Secondary | ICD-10-CM | POA: Diagnosis not present

## 2018-08-22 DIAGNOSIS — M6281 Muscle weakness (generalized): Secondary | ICD-10-CM | POA: Diagnosis not present

## 2018-08-22 DIAGNOSIS — Z471 Aftercare following joint replacement surgery: Secondary | ICD-10-CM | POA: Diagnosis not present

## 2018-08-22 DIAGNOSIS — R2689 Other abnormalities of gait and mobility: Secondary | ICD-10-CM | POA: Diagnosis not present

## 2018-08-26 DIAGNOSIS — R262 Difficulty in walking, not elsewhere classified: Secondary | ICD-10-CM | POA: Diagnosis not present

## 2018-08-26 DIAGNOSIS — R2689 Other abnormalities of gait and mobility: Secondary | ICD-10-CM | POA: Diagnosis not present

## 2018-08-26 DIAGNOSIS — Z471 Aftercare following joint replacement surgery: Secondary | ICD-10-CM | POA: Diagnosis not present

## 2018-08-26 DIAGNOSIS — M6281 Muscle weakness (generalized): Secondary | ICD-10-CM | POA: Diagnosis not present

## 2018-08-27 DIAGNOSIS — Z471 Aftercare following joint replacement surgery: Secondary | ICD-10-CM | POA: Diagnosis not present

## 2018-08-27 DIAGNOSIS — M6281 Muscle weakness (generalized): Secondary | ICD-10-CM | POA: Diagnosis not present

## 2018-08-27 DIAGNOSIS — R2689 Other abnormalities of gait and mobility: Secondary | ICD-10-CM | POA: Diagnosis not present

## 2018-08-27 DIAGNOSIS — R262 Difficulty in walking, not elsewhere classified: Secondary | ICD-10-CM | POA: Diagnosis not present

## 2018-09-01 DIAGNOSIS — M6281 Muscle weakness (generalized): Secondary | ICD-10-CM | POA: Diagnosis not present

## 2018-09-01 DIAGNOSIS — Z471 Aftercare following joint replacement surgery: Secondary | ICD-10-CM | POA: Diagnosis not present

## 2018-09-01 DIAGNOSIS — R262 Difficulty in walking, not elsewhere classified: Secondary | ICD-10-CM | POA: Diagnosis not present

## 2018-09-01 DIAGNOSIS — R2689 Other abnormalities of gait and mobility: Secondary | ICD-10-CM | POA: Diagnosis not present

## 2018-09-16 DIAGNOSIS — M2170 Unequal limb length (acquired), unspecified site: Secondary | ICD-10-CM | POA: Diagnosis not present

## 2018-09-16 DIAGNOSIS — I10 Essential (primary) hypertension: Secondary | ICD-10-CM | POA: Diagnosis not present

## 2018-09-16 DIAGNOSIS — Z96641 Presence of right artificial hip joint: Secondary | ICD-10-CM | POA: Diagnosis not present

## 2018-09-16 DIAGNOSIS — E119 Type 2 diabetes mellitus without complications: Secondary | ICD-10-CM | POA: Diagnosis not present

## 2018-09-16 DIAGNOSIS — Z471 Aftercare following joint replacement surgery: Secondary | ICD-10-CM | POA: Diagnosis not present

## 2018-09-21 DIAGNOSIS — R05 Cough: Secondary | ICD-10-CM | POA: Diagnosis not present

## 2018-09-21 DIAGNOSIS — R0989 Other specified symptoms and signs involving the circulatory and respiratory systems: Secondary | ICD-10-CM | POA: Diagnosis not present

## 2018-10-21 DIAGNOSIS — R2689 Other abnormalities of gait and mobility: Secondary | ICD-10-CM | POA: Diagnosis not present

## 2018-10-21 DIAGNOSIS — Z471 Aftercare following joint replacement surgery: Secondary | ICD-10-CM | POA: Diagnosis not present

## 2018-10-21 DIAGNOSIS — M6281 Muscle weakness (generalized): Secondary | ICD-10-CM | POA: Diagnosis not present

## 2018-10-21 DIAGNOSIS — R262 Difficulty in walking, not elsewhere classified: Secondary | ICD-10-CM | POA: Diagnosis not present

## 2018-10-22 DIAGNOSIS — Z471 Aftercare following joint replacement surgery: Secondary | ICD-10-CM | POA: Diagnosis not present

## 2018-10-22 DIAGNOSIS — M6281 Muscle weakness (generalized): Secondary | ICD-10-CM | POA: Diagnosis not present

## 2018-10-22 DIAGNOSIS — R262 Difficulty in walking, not elsewhere classified: Secondary | ICD-10-CM | POA: Diagnosis not present

## 2018-10-22 DIAGNOSIS — R2689 Other abnormalities of gait and mobility: Secondary | ICD-10-CM | POA: Diagnosis not present

## 2018-10-27 DIAGNOSIS — Z471 Aftercare following joint replacement surgery: Secondary | ICD-10-CM | POA: Diagnosis not present

## 2018-10-27 DIAGNOSIS — R2689 Other abnormalities of gait and mobility: Secondary | ICD-10-CM | POA: Diagnosis not present

## 2018-10-27 DIAGNOSIS — M6281 Muscle weakness (generalized): Secondary | ICD-10-CM | POA: Diagnosis not present

## 2018-10-27 DIAGNOSIS — R262 Difficulty in walking, not elsewhere classified: Secondary | ICD-10-CM | POA: Diagnosis not present

## 2018-10-29 DIAGNOSIS — Z471 Aftercare following joint replacement surgery: Secondary | ICD-10-CM | POA: Diagnosis not present

## 2018-10-29 DIAGNOSIS — M6281 Muscle weakness (generalized): Secondary | ICD-10-CM | POA: Diagnosis not present

## 2018-10-29 DIAGNOSIS — R262 Difficulty in walking, not elsewhere classified: Secondary | ICD-10-CM | POA: Diagnosis not present

## 2018-10-29 DIAGNOSIS — R2689 Other abnormalities of gait and mobility: Secondary | ICD-10-CM | POA: Diagnosis not present

## 2018-11-03 DIAGNOSIS — Z471 Aftercare following joint replacement surgery: Secondary | ICD-10-CM | POA: Diagnosis not present

## 2018-11-03 DIAGNOSIS — R262 Difficulty in walking, not elsewhere classified: Secondary | ICD-10-CM | POA: Diagnosis not present

## 2018-11-03 DIAGNOSIS — R2689 Other abnormalities of gait and mobility: Secondary | ICD-10-CM | POA: Diagnosis not present

## 2018-11-03 DIAGNOSIS — M6281 Muscle weakness (generalized): Secondary | ICD-10-CM | POA: Diagnosis not present

## 2018-11-05 DIAGNOSIS — Z471 Aftercare following joint replacement surgery: Secondary | ICD-10-CM | POA: Diagnosis not present

## 2018-11-05 DIAGNOSIS — R262 Difficulty in walking, not elsewhere classified: Secondary | ICD-10-CM | POA: Diagnosis not present

## 2018-11-05 DIAGNOSIS — M6281 Muscle weakness (generalized): Secondary | ICD-10-CM | POA: Diagnosis not present

## 2018-11-05 DIAGNOSIS — R2689 Other abnormalities of gait and mobility: Secondary | ICD-10-CM | POA: Diagnosis not present

## 2018-11-07 DIAGNOSIS — R0981 Nasal congestion: Secondary | ICD-10-CM | POA: Diagnosis not present

## 2018-11-07 DIAGNOSIS — R110 Nausea: Secondary | ICD-10-CM | POA: Diagnosis not present

## 2018-11-07 DIAGNOSIS — I10 Essential (primary) hypertension: Secondary | ICD-10-CM | POA: Diagnosis not present

## 2018-11-07 DIAGNOSIS — R0902 Hypoxemia: Secondary | ICD-10-CM | POA: Diagnosis not present

## 2018-11-07 DIAGNOSIS — F329 Major depressive disorder, single episode, unspecified: Secondary | ICD-10-CM | POA: Diagnosis not present

## 2018-11-10 DIAGNOSIS — J189 Pneumonia, unspecified organism: Secondary | ICD-10-CM | POA: Diagnosis not present

## 2018-11-10 DIAGNOSIS — R52 Pain, unspecified: Secondary | ICD-10-CM | POA: Diagnosis not present

## 2018-11-10 DIAGNOSIS — Z781 Physical restraint status: Secondary | ICD-10-CM | POA: Diagnosis not present

## 2018-11-10 DIAGNOSIS — E162 Hypoglycemia, unspecified: Secondary | ICD-10-CM | POA: Diagnosis not present

## 2018-11-10 DIAGNOSIS — E039 Hypothyroidism, unspecified: Secondary | ICD-10-CM | POA: Diagnosis not present

## 2018-11-10 DIAGNOSIS — R9431 Abnormal electrocardiogram [ECG] [EKG]: Secondary | ICD-10-CM | POA: Diagnosis not present

## 2018-11-10 DIAGNOSIS — G894 Chronic pain syndrome: Secondary | ICD-10-CM | POA: Diagnosis not present

## 2018-11-10 DIAGNOSIS — Z515 Encounter for palliative care: Secondary | ICD-10-CM | POA: Diagnosis not present

## 2018-11-10 DIAGNOSIS — N289 Disorder of kidney and ureter, unspecified: Secondary | ICD-10-CM | POA: Diagnosis not present

## 2018-11-10 DIAGNOSIS — J80 Acute respiratory distress syndrome: Secondary | ICD-10-CM | POA: Diagnosis not present

## 2018-11-10 DIAGNOSIS — R918 Other nonspecific abnormal finding of lung field: Secondary | ICD-10-CM | POA: Diagnosis not present

## 2018-11-10 DIAGNOSIS — Z471 Aftercare following joint replacement surgery: Secondary | ICD-10-CM | POA: Diagnosis not present

## 2018-11-10 DIAGNOSIS — G8929 Other chronic pain: Secondary | ICD-10-CM | POA: Diagnosis not present

## 2018-11-10 DIAGNOSIS — E11649 Type 2 diabetes mellitus with hypoglycemia without coma: Secondary | ICD-10-CM | POA: Diagnosis not present

## 2018-11-10 DIAGNOSIS — R1310 Dysphagia, unspecified: Secondary | ICD-10-CM | POA: Diagnosis present

## 2018-11-10 DIAGNOSIS — R55 Syncope and collapse: Secondary | ICD-10-CM | POA: Diagnosis not present

## 2018-11-10 DIAGNOSIS — J9601 Acute respiratory failure with hypoxia: Secondary | ICD-10-CM | POA: Diagnosis not present

## 2018-11-10 DIAGNOSIS — E161 Other hypoglycemia: Secondary | ICD-10-CM | POA: Diagnosis not present

## 2018-11-10 DIAGNOSIS — T84020A Dislocation of internal right hip prosthesis, initial encounter: Secondary | ICD-10-CM | POA: Diagnosis not present

## 2018-11-10 DIAGNOSIS — N390 Urinary tract infection, site not specified: Secondary | ICD-10-CM | POA: Diagnosis not present

## 2018-11-10 DIAGNOSIS — J188 Other pneumonia, unspecified organism: Secondary | ICD-10-CM | POA: Diagnosis present

## 2018-11-10 DIAGNOSIS — Z933 Colostomy status: Secondary | ICD-10-CM | POA: Diagnosis not present

## 2018-11-10 DIAGNOSIS — G629 Polyneuropathy, unspecified: Secondary | ICD-10-CM | POA: Diagnosis not present

## 2018-11-10 DIAGNOSIS — E1122 Type 2 diabetes mellitus with diabetic chronic kidney disease: Secondary | ICD-10-CM | POA: Diagnosis present

## 2018-11-10 DIAGNOSIS — R1314 Dysphagia, pharyngoesophageal phase: Secondary | ICD-10-CM | POA: Diagnosis not present

## 2018-11-10 DIAGNOSIS — E1142 Type 2 diabetes mellitus with diabetic polyneuropathy: Secondary | ICD-10-CM | POA: Diagnosis present

## 2018-11-10 DIAGNOSIS — N183 Chronic kidney disease, stage 3 (moderate): Secondary | ICD-10-CM | POA: Diagnosis not present

## 2018-11-10 DIAGNOSIS — F329 Major depressive disorder, single episode, unspecified: Secondary | ICD-10-CM | POA: Diagnosis not present

## 2018-11-10 DIAGNOSIS — K8020 Calculus of gallbladder without cholecystitis without obstruction: Secondary | ICD-10-CM | POA: Diagnosis not present

## 2018-11-10 DIAGNOSIS — R2689 Other abnormalities of gait and mobility: Secondary | ICD-10-CM | POA: Diagnosis not present

## 2018-11-10 DIAGNOSIS — M24542 Contracture, left hand: Secondary | ICD-10-CM | POA: Diagnosis present

## 2018-11-10 DIAGNOSIS — N179 Acute kidney failure, unspecified: Secondary | ICD-10-CM | POA: Diagnosis not present

## 2018-11-10 DIAGNOSIS — L89154 Pressure ulcer of sacral region, stage 4: Secondary | ICD-10-CM | POA: Diagnosis not present

## 2018-11-10 DIAGNOSIS — Z96641 Presence of right artificial hip joint: Secondary | ICD-10-CM | POA: Diagnosis not present

## 2018-11-10 DIAGNOSIS — R4182 Altered mental status, unspecified: Secondary | ICD-10-CM | POA: Diagnosis not present

## 2018-11-10 DIAGNOSIS — E875 Hyperkalemia: Secondary | ICD-10-CM | POA: Diagnosis not present

## 2018-11-10 DIAGNOSIS — T83511A Infection and inflammatory reaction due to indwelling urethral catheter, initial encounter: Secondary | ICD-10-CM | POA: Diagnosis not present

## 2018-11-10 DIAGNOSIS — I129 Hypertensive chronic kidney disease with stage 1 through stage 4 chronic kidney disease, or unspecified chronic kidney disease: Secondary | ICD-10-CM | POA: Diagnosis not present

## 2018-11-10 DIAGNOSIS — R4020 Unspecified coma: Secondary | ICD-10-CM | POA: Diagnosis not present

## 2018-11-10 DIAGNOSIS — N492 Inflammatory disorders of scrotum: Secondary | ICD-10-CM | POA: Diagnosis not present

## 2018-11-10 DIAGNOSIS — T8451XA Infection and inflammatory reaction due to internal right hip prosthesis, initial encounter: Secondary | ICD-10-CM | POA: Diagnosis not present

## 2018-11-10 DIAGNOSIS — J159 Unspecified bacterial pneumonia: Secondary | ICD-10-CM | POA: Diagnosis not present

## 2018-11-10 DIAGNOSIS — R0602 Shortness of breath: Secondary | ICD-10-CM | POA: Diagnosis not present

## 2018-11-10 DIAGNOSIS — L089 Local infection of the skin and subcutaneous tissue, unspecified: Secondary | ICD-10-CM | POA: Diagnosis not present

## 2018-11-10 DIAGNOSIS — J181 Lobar pneumonia, unspecified organism: Secondary | ICD-10-CM | POA: Diagnosis not present

## 2018-11-10 DIAGNOSIS — E785 Hyperlipidemia, unspecified: Secondary | ICD-10-CM | POA: Diagnosis not present

## 2018-11-18 ENCOUNTER — Non-Acute Institutional Stay: Payer: Medicare Other | Admitting: Internal Medicine

## 2018-11-18 DIAGNOSIS — Z515 Encounter for palliative care: Secondary | ICD-10-CM

## 2018-11-19 ENCOUNTER — Other Ambulatory Visit: Payer: Self-pay

## 2018-11-19 DIAGNOSIS — R54 Age-related physical debility: Secondary | ICD-10-CM | POA: Diagnosis not present

## 2018-11-19 DIAGNOSIS — M150 Primary generalized (osteo)arthritis: Secondary | ICD-10-CM | POA: Diagnosis not present

## 2018-11-19 DIAGNOSIS — Z515 Encounter for palliative care: Secondary | ICD-10-CM | POA: Insufficient documentation

## 2018-11-19 DIAGNOSIS — J189 Pneumonia, unspecified organism: Secondary | ICD-10-CM | POA: Diagnosis not present

## 2018-11-19 DIAGNOSIS — I10 Essential (primary) hypertension: Secondary | ICD-10-CM | POA: Diagnosis not present

## 2018-11-19 NOTE — Progress Notes (Signed)
Therapist, nutritional Palliative Care Consult Note Telephone: [phone]  Fax: [phone]  PATIENT NAME: Phillip Grant DOB: [birth year] MRN: [id number]  PRIMARY CARE PROVIDER:   Wilhelmina Mcardle, MD at [facility]  REFERRING PROVIDER:  Wilhelmina Mcardle, MD   RESPONSIBLE PARTY:   self   RECOMMENDATIONS and PLAN:   Palliative Care Encounter  Z51.5  1. Chronic pain:  Well controlled with use of Fentanyl transdermal 25 mcg patch, Oxycodone 5mg  IR q4hprn and Oxycodone 10mg  PO BID  Continue same plan.  2.  Depression:  Chronic and well managed with use of Cymbalta 30mg .  Continue to monitor.  3.  Advanced care planning: Pt's primary goal is for comfort only without escilation of medical care or treatments.  He also does not want to return to the hospital.  He agrees to take medication that will manage chronic pain, depression and constipation.  All remaining medications have been discontinued. Advanced care directives were readdressed and he continues to desire DNAR status and MOST selections of comfort measures, IV and antibiotics if indicated, no tube feeding. He desires to transition to Hospice care when his health declines more.  Palliative care will continue to follow patient in aprox 1-2 weeks for re-evaluation.  Plan on further discussion with provider.     I spent 60 minutes providing this consultation,  from 1600 to 1700 at bedside. More than 50% of the time in this consultation was spent coordinating communication with patient and clinical staff.   HISTORY OF PRESENT ILLNESS:  Phillip Grant is a 78 y.o. year old male with multiple medical problems including Type 2 diabetes with significant polyneuropathy, neurogenic bladder and osteoarthritis with chronic back, joint pain. He was hospitalized from 3/2-11/13/2018 related to altered mental status, bilat lower lobe pneumonia vs aspiration pneumonitis, hypoglycemia and reports of a scrotal abscess. He was treated with  IV antibiotics and refused any additional treatment.  He desired comfort care only and was evaluated by Palliative medicine in the hospital.  Plans for return to snf with Hospice care but pt was not admitted to Hospice. He underwent a R hip replacement in 07/2018 and developed pneumonia postop. Pt is bedbound and normally requires total care from staff which he is reluctant to allow staff to perform hygienic needs.  He is able to feed self.  He has a foley catheter and colostomy. Weight is 207 as compared to 212 in 07/2018.  Albumin level= 3.3 at discharge on 11/13/18.Palliative Care was asked to help address goals of care.   CODE STATUS: DNAR/DNI  PPS: 30%  Non-ambulatory HOSPICE ELIGIBILITY/DIAGNOSIS: TBD  PAST MEDICAL HISTORY:  Past Medical History:  Diagnosis Date  . Depression   . Diabetes mellitus without complication (HCC)    NIDDM   . GERD (gastroesophageal reflux disease)   . Hyperlipidemia   . Hypertension   . Neurogenic bladder   . Osteoarthritis      ALLERGIES:  Allergies  Allergen Reactions  . Acyclovir And Related   . Macrobid [Nitrofurantoin Macrocrystal]     Documented on MAR  . Morphine And Related     Documented on MAR      PHYSICAL EXAM:   General: NAD, chronically ill and frail appearing elderly male reclining in bed Cardiovascular: regular rate and rhythm Pulmonary: clear ant fields Abdomen: soft, nontender, + bowel sounds, colostomy noted GU: no suprapubic tenderness, foley catheter with clear amber urine Extremities: no edema, decreased muscle mass, contractures of fingers and toes Skin: exposed skin  intact. Pt declines examination of posterior trunk.  Foul odor noted. Pt denies presence of wounds. Neurological: A&O x3. Weakness but otherwise nonfocal Psych: Slight anxious mood.  Hesitant with physical exam  Margaretha Sheffield, NP-C

## 2018-12-05 DIAGNOSIS — I10 Essential (primary) hypertension: Secondary | ICD-10-CM | POA: Diagnosis not present

## 2018-12-08 ENCOUNTER — Non-Acute Institutional Stay: Payer: Medicare Other | Admitting: Internal Medicine

## 2018-12-08 DIAGNOSIS — Z515 Encounter for palliative care: Secondary | ICD-10-CM | POA: Diagnosis not present

## 2018-12-10 ENCOUNTER — Other Ambulatory Visit: Payer: Self-pay

## 2018-12-10 NOTE — Progress Notes (Signed)
    Therapist, nutritional Palliative Care Consult Note Telephone: [phone]  Fax: [phone]  PATIENT NAME: Phillip Grant DOB: 11-09-1940 MRN: [id number]  PRIMARY CARE PROVIDER:   Wilhelmina Mcardle, MD at [facility]  REFERRING PROVIDER:  Wilhelmina Mcardle, MD   RESPONSIBLE PARTY:   self   RECOMMENDATIONS and PLAN:   Palliative Care Encounter  Z51.5  1. Chronic pain:  Well controlled with use of Fentanyl transdermal 25 mcg patch, Oxycodone 5mg  IR q4hprn and Oxycodone 10mg  PO BID  Continue same plan.  2.  Depression:  Chronic, Improved since most recent visit.  Continue to monitor.  3. Heel wound:  Superficial. Skin prep and float heels.  Encouraged adequate nutrition to promote healing.  Monitor  4.  Advanced care planning: Goals remain for receipt of comfort care. Pt. Intermittently considers restart of thyroid medications pending results of lab evaluation.   Advanced care directives remain unchanged with DNAR/DNI and no return to the hospital. Palliative care will continue to follow patient for potential decline.  I spent 35 minutes providing this consultation,  from 1400 to 1435 at bedside. More than 50% of the time in this consultation was spent coordinating communication with patient and clinical staff.   HISTORY OF PRESENT ILLNESS: Follow up with Phillip Grant is a 78 y.o. year old male with multiple medical problems including Type 2 diabetes with significant polyneuropathy, neurogenic bladder and osteoarthritis with chronic back, joint pain. He remains bed bound. Clinical staff reports that his nutritional intake and mood have improved. Unknown current weight due to pt's refusal to weight.    CODE STATUS: DNAR/DNI  PPS: 30%  Non-ambulatory HOSPICE ELIGIBILITY/DIAGNOSIS: TBD  PAST MEDICAL HISTORY:  Past Medical History:  Diagnosis Date  . Depression   . Diabetes mellitus without complication (HCC)    NIDDM   . GERD (gastroesophageal reflux disease)    . Hyperlipidemia   . Hypertension   . Neurogenic bladder   . Osteoarthritis      PHYSICAL EXAM:   General: NAD, chronically ill and frail appearing elderly male reclining in bed Cardiovascular: regular rate and rhythm Pulmonary: clear ant fields Abdomen: soft, nontender, + bowel sounds, colostomy noted GU: no suprapubic tenderness, foley catheter with clear amber urine Extremities: no edema, decreased muscle mass, contractures of fingers and toes Skin: Superficial o.5cm pressure wound L heel. No discharge or signs of infection. Neurological: A&O x3. Intermittent jerks of BLE. Generalized weakness but otherwise nonfocal Psych: Elevated mood.  Talkative and cooperative.  Margaretha Sheffield, NP-C

## 2018-12-18 ENCOUNTER — Non-Acute Institutional Stay: Payer: Medicare Other | Admitting: Internal Medicine

## 2018-12-18 DIAGNOSIS — Z515 Encounter for palliative care: Secondary | ICD-10-CM

## 2018-12-24 NOTE — Progress Notes (Signed)
    Therapist, nutritional Palliative Care Consult Note Telephone: [phone]  Fax: [phone]  PATIENT NAME: KAYLUP KNABLE DOB: November 16, 1940 MRN: [id number]  PRIMARY CARE PROVIDER:   Wilhelmina Mcardle, MD at [facility]  REFERRING PROVIDER:  Wilhelmina Mcardle, MD   RESPONSIBLE PARTY:   self   RECOMMENDATIONS and PLAN:   Palliative Care Encounter  Z51.5  1. Chronic pain:  Well controlled with use of Fentanyl transdermal 25 mcg patch, Oxycodone 5mg  IR q4hprn and Oxycodone 10mg  PO BID  Continue same plan.  2.  Depression:  Chronic and controlled.  Continue to monitor.  3. Heel wound:  Resolved.  Continue to float heels  4.  Advanced care planning: Goals remain for receipt of comfort care.  No restart of diabetic or thyroid medications since lab values are stable without use of same   Advanced care directives remain unchanged with DNAR/DNI and no return to the hospital.  He has stabilized since hospital discharge.  Palliative care will continue to follow patient for potential decline in approx 3 weeks.  Due to the current COVID-19 crisis, this visit was completed via  Telehealth from my office.  Prior consent was obtained from the patient.  I spent 25 minutes providing this consultation,  from 1200 to 1225. More than 50% of the time in this consultation was spent coordinating communication with patient and clinical staff.   HISTORY OF PRESENT ILLNESS: Follow up with Georjean Mode.  He remains totally bed bound.   Clinical staff reports that his nutritional intake and mood have improved. Elimination occurs via colostomy and foley catheter.  No reports of new wounds or falls.  Unknown current weight due to pt's refusal to weight.    CODE STATUS: DNAR/DNI  PPS: 30%  Non-ambulatory HOSPICE ELIGIBILITY/DIAGNOSIS: TBD  PAST MEDICAL HISTORY:  Past Medical History:  Diagnosis Date  . Depression   . Diabetes mellitus without complication (HCC)    NIDDM   . GERD  (gastroesophageal reflux disease)   . Hyperlipidemia   . Hypertension   . Neurogenic bladder   . Osteoarthritis      PHYSICAL EXAM:   General: NAD, chronically ill and frail appearing elderly male reclining in bed Pulmonary: no increased effort Extremities: no edema, decreased muscle mass, contractures of fingers and toes Neurological: A&O x3.  Psych: Talkative and cooperative.  Margaretha Sheffield, NP-C

## 2019-01-16 DIAGNOSIS — R5381 Other malaise: Secondary | ICD-10-CM | POA: Diagnosis not present

## 2019-01-16 DIAGNOSIS — I10 Essential (primary) hypertension: Secondary | ICD-10-CM | POA: Diagnosis not present

## 2019-01-16 DIAGNOSIS — M160 Bilateral primary osteoarthritis of hip: Secondary | ICD-10-CM | POA: Diagnosis not present

## 2019-01-16 DIAGNOSIS — G8929 Other chronic pain: Secondary | ICD-10-CM | POA: Diagnosis not present

## 2019-01-27 DIAGNOSIS — F339 Major depressive disorder, recurrent, unspecified: Secondary | ICD-10-CM | POA: Diagnosis not present

## 2019-01-27 DIAGNOSIS — F419 Anxiety disorder, unspecified: Secondary | ICD-10-CM | POA: Diagnosis not present

## 2019-02-04 ENCOUNTER — Other Ambulatory Visit: Payer: Self-pay

## 2019-02-04 ENCOUNTER — Non-Acute Institutional Stay: Payer: Medicare Other | Admitting: Internal Medicine

## 2019-02-11 DIAGNOSIS — S91301D Unspecified open wound, right foot, subsequent encounter: Secondary | ICD-10-CM | POA: Diagnosis not present

## 2019-03-18 DIAGNOSIS — I10 Essential (primary) hypertension: Secondary | ICD-10-CM | POA: Diagnosis not present

## 2019-03-18 DIAGNOSIS — E119 Type 2 diabetes mellitus without complications: Secondary | ICD-10-CM | POA: Diagnosis not present

## 2019-03-25 DIAGNOSIS — L89150 Pressure ulcer of sacral region, unstageable: Secondary | ICD-10-CM | POA: Diagnosis not present

## 2019-04-01 DIAGNOSIS — L89154 Pressure ulcer of sacral region, stage 4: Secondary | ICD-10-CM | POA: Diagnosis not present

## 2019-04-02 DIAGNOSIS — N183 Chronic kidney disease, stage 3 (moderate): Secondary | ICD-10-CM | POA: Diagnosis not present

## 2019-04-02 DIAGNOSIS — F329 Major depressive disorder, single episode, unspecified: Secondary | ICD-10-CM | POA: Diagnosis not present

## 2019-04-02 DIAGNOSIS — E1122 Type 2 diabetes mellitus with diabetic chronic kidney disease: Secondary | ICD-10-CM | POA: Diagnosis not present

## 2019-04-02 DIAGNOSIS — N319 Neuromuscular dysfunction of bladder, unspecified: Secondary | ICD-10-CM | POA: Diagnosis not present

## 2019-04-02 DIAGNOSIS — E039 Hypothyroidism, unspecified: Secondary | ICD-10-CM | POA: Diagnosis not present

## 2019-04-02 DIAGNOSIS — M160 Bilateral primary osteoarthritis of hip: Secondary | ICD-10-CM | POA: Diagnosis not present

## 2019-04-02 DIAGNOSIS — I129 Hypertensive chronic kidney disease with stage 1 through stage 4 chronic kidney disease, or unspecified chronic kidney disease: Secondary | ICD-10-CM | POA: Diagnosis not present

## 2019-04-08 DIAGNOSIS — B342 Coronavirus infection, unspecified: Secondary | ICD-10-CM | POA: Diagnosis not present

## 2019-05-25 DIAGNOSIS — R5381 Other malaise: Secondary | ICD-10-CM | POA: Diagnosis not present

## 2019-05-25 DIAGNOSIS — M8949 Other hypertrophic osteoarthropathy, multiple sites: Secondary | ICD-10-CM | POA: Diagnosis not present

## 2019-05-25 DIAGNOSIS — E039 Hypothyroidism, unspecified: Secondary | ICD-10-CM | POA: Diagnosis not present

## 2019-05-25 DIAGNOSIS — G8929 Other chronic pain: Secondary | ICD-10-CM | POA: Diagnosis not present

## 2019-06-28 DIAGNOSIS — R300 Dysuria: Secondary | ICD-10-CM | POA: Diagnosis not present

## 2019-06-29 DIAGNOSIS — R0602 Shortness of breath: Secondary | ICD-10-CM | POA: Diagnosis not present

## 2019-06-29 DIAGNOSIS — D72828 Other elevated white blood cell count: Secondary | ICD-10-CM | POA: Diagnosis not present

## 2019-06-29 DIAGNOSIS — R062 Wheezing: Secondary | ICD-10-CM | POA: Diagnosis not present

## 2019-06-30 DIAGNOSIS — D72829 Elevated white blood cell count, unspecified: Secondary | ICD-10-CM | POA: Diagnosis not present

## 2019-06-30 DIAGNOSIS — J189 Pneumonia, unspecified organism: Secondary | ICD-10-CM | POA: Diagnosis not present

## 2019-06-30 DIAGNOSIS — R05 Cough: Secondary | ICD-10-CM | POA: Diagnosis not present

## 2019-06-30 DIAGNOSIS — N390 Urinary tract infection, site not specified: Secondary | ICD-10-CM | POA: Diagnosis not present

## 2019-07-01 DIAGNOSIS — N390 Urinary tract infection, site not specified: Secondary | ICD-10-CM | POA: Diagnosis not present

## 2019-07-01 DIAGNOSIS — R300 Dysuria: Secondary | ICD-10-CM | POA: Diagnosis not present

## 2019-07-02 DIAGNOSIS — N390 Urinary tract infection, site not specified: Secondary | ICD-10-CM | POA: Diagnosis not present

## 2019-07-02 DIAGNOSIS — R300 Dysuria: Secondary | ICD-10-CM | POA: Diagnosis not present

## 2019-07-04 DIAGNOSIS — I10 Essential (primary) hypertension: Secondary | ICD-10-CM | POA: Diagnosis not present

## 2019-07-07 DIAGNOSIS — I10 Essential (primary) hypertension: Secondary | ICD-10-CM | POA: Diagnosis not present

## 2019-07-07 DIAGNOSIS — F419 Anxiety disorder, unspecified: Secondary | ICD-10-CM | POA: Diagnosis not present

## 2019-07-07 DIAGNOSIS — F339 Major depressive disorder, recurrent, unspecified: Secondary | ICD-10-CM | POA: Diagnosis not present

## 2019-07-24 DIAGNOSIS — E1122 Type 2 diabetes mellitus with diabetic chronic kidney disease: Secondary | ICD-10-CM | POA: Diagnosis not present

## 2019-07-24 DIAGNOSIS — G8929 Other chronic pain: Secondary | ICD-10-CM | POA: Diagnosis not present

## 2019-07-24 DIAGNOSIS — F329 Major depressive disorder, single episode, unspecified: Secondary | ICD-10-CM | POA: Diagnosis not present

## 2019-07-24 DIAGNOSIS — L89159 Pressure ulcer of sacral region, unspecified stage: Secondary | ICD-10-CM | POA: Diagnosis not present

## 2019-07-24 DIAGNOSIS — N183 Chronic kidney disease, stage 3 unspecified: Secondary | ICD-10-CM | POA: Diagnosis not present

## 2019-07-24 DIAGNOSIS — N319 Neuromuscular dysfunction of bladder, unspecified: Secondary | ICD-10-CM | POA: Diagnosis not present

## 2019-07-24 DIAGNOSIS — I129 Hypertensive chronic kidney disease with stage 1 through stage 4 chronic kidney disease, or unspecified chronic kidney disease: Secondary | ICD-10-CM | POA: Diagnosis not present

## 2019-08-05 DIAGNOSIS — R300 Dysuria: Secondary | ICD-10-CM | POA: Diagnosis not present

## 2019-08-05 DIAGNOSIS — I10 Essential (primary) hypertension: Secondary | ICD-10-CM | POA: Diagnosis not present

## 2019-08-05 DIAGNOSIS — N390 Urinary tract infection, site not specified: Secondary | ICD-10-CM | POA: Diagnosis not present

## 2019-08-26 ENCOUNTER — Non-Acute Institutional Stay: Payer: Medicare Other | Admitting: Internal Medicine

## 2019-08-27 ENCOUNTER — Other Ambulatory Visit: Payer: Self-pay

## 2019-09-08 DIAGNOSIS — Z23 Encounter for immunization: Secondary | ICD-10-CM | POA: Diagnosis not present

## 2019-09-18 DIAGNOSIS — E119 Type 2 diabetes mellitus without complications: Secondary | ICD-10-CM | POA: Diagnosis not present

## 2019-09-18 DIAGNOSIS — I10 Essential (primary) hypertension: Secondary | ICD-10-CM | POA: Diagnosis not present

## 2019-09-25 DIAGNOSIS — M160 Bilateral primary osteoarthritis of hip: Secondary | ICD-10-CM | POA: Diagnosis not present

## 2019-09-25 DIAGNOSIS — G8929 Other chronic pain: Secondary | ICD-10-CM | POA: Diagnosis not present

## 2019-09-25 DIAGNOSIS — L89154 Pressure ulcer of sacral region, stage 4: Secondary | ICD-10-CM | POA: Diagnosis not present

## 2019-09-25 DIAGNOSIS — N319 Neuromuscular dysfunction of bladder, unspecified: Secondary | ICD-10-CM | POA: Diagnosis not present

## 2019-10-06 DIAGNOSIS — Z23 Encounter for immunization: Secondary | ICD-10-CM | POA: Diagnosis not present

## 2019-10-13 DIAGNOSIS — G8929 Other chronic pain: Secondary | ICD-10-CM | POA: Diagnosis not present

## 2019-10-13 DIAGNOSIS — F329 Major depressive disorder, single episode, unspecified: Secondary | ICD-10-CM | POA: Diagnosis not present

## 2019-11-16 DIAGNOSIS — N319 Neuromuscular dysfunction of bladder, unspecified: Secondary | ICD-10-CM | POA: Diagnosis not present

## 2019-11-16 DIAGNOSIS — I129 Hypertensive chronic kidney disease with stage 1 through stage 4 chronic kidney disease, or unspecified chronic kidney disease: Secondary | ICD-10-CM | POA: Diagnosis not present

## 2019-11-16 DIAGNOSIS — E1122 Type 2 diabetes mellitus with diabetic chronic kidney disease: Secondary | ICD-10-CM | POA: Diagnosis not present

## 2019-11-16 DIAGNOSIS — E039 Hypothyroidism, unspecified: Secondary | ICD-10-CM | POA: Diagnosis not present

## 2019-11-16 DIAGNOSIS — M159 Polyosteoarthritis, unspecified: Secondary | ICD-10-CM | POA: Diagnosis not present

## 2019-11-16 DIAGNOSIS — L89154 Pressure ulcer of sacral region, stage 4: Secondary | ICD-10-CM | POA: Diagnosis not present

## 2019-11-16 DIAGNOSIS — F329 Major depressive disorder, single episode, unspecified: Secondary | ICD-10-CM | POA: Diagnosis not present

## 2019-11-16 DIAGNOSIS — N183 Chronic kidney disease, stage 3 unspecified: Secondary | ICD-10-CM | POA: Diagnosis not present

## 2019-11-16 DIAGNOSIS — M62838 Other muscle spasm: Secondary | ICD-10-CM | POA: Diagnosis not present

## 2019-11-18 DIAGNOSIS — Z7409 Other reduced mobility: Secondary | ICD-10-CM | POA: Diagnosis not present

## 2019-11-18 DIAGNOSIS — M79604 Pain in right leg: Secondary | ICD-10-CM | POA: Diagnosis not present

## 2019-11-18 DIAGNOSIS — M62838 Other muscle spasm: Secondary | ICD-10-CM | POA: Diagnosis not present

## 2019-11-18 DIAGNOSIS — Z789 Other specified health status: Secondary | ICD-10-CM | POA: Diagnosis not present

## 2019-11-30 DIAGNOSIS — R112 Nausea with vomiting, unspecified: Secondary | ICD-10-CM | POA: Diagnosis not present

## 2019-11-30 DIAGNOSIS — R300 Dysuria: Secondary | ICD-10-CM | POA: Diagnosis not present

## 2019-11-30 DIAGNOSIS — N390 Urinary tract infection, site not specified: Secondary | ICD-10-CM | POA: Diagnosis not present

## 2019-12-01 DIAGNOSIS — R278 Other lack of coordination: Secondary | ICD-10-CM | POA: Diagnosis not present

## 2019-12-01 DIAGNOSIS — R300 Dysuria: Secondary | ICD-10-CM | POA: Diagnosis not present

## 2019-12-01 DIAGNOSIS — N390 Urinary tract infection, site not specified: Secondary | ICD-10-CM | POA: Diagnosis not present

## 2019-12-01 DIAGNOSIS — R0689 Other abnormalities of breathing: Secondary | ICD-10-CM | POA: Diagnosis not present

## 2019-12-01 DIAGNOSIS — L89154 Pressure ulcer of sacral region, stage 4: Secondary | ICD-10-CM | POA: Diagnosis not present

## 2019-12-01 DIAGNOSIS — M6281 Muscle weakness (generalized): Secondary | ICD-10-CM | POA: Diagnosis not present

## 2019-12-01 DIAGNOSIS — R0602 Shortness of breath: Secondary | ICD-10-CM | POA: Diagnosis not present

## 2019-12-02 DIAGNOSIS — R110 Nausea: Secondary | ICD-10-CM | POA: Diagnosis not present

## 2019-12-02 DIAGNOSIS — J189 Pneumonia, unspecified organism: Secondary | ICD-10-CM | POA: Diagnosis not present

## 2019-12-02 DIAGNOSIS — N183 Chronic kidney disease, stage 3 unspecified: Secondary | ICD-10-CM | POA: Diagnosis not present

## 2019-12-02 DIAGNOSIS — N390 Urinary tract infection, site not specified: Secondary | ICD-10-CM | POA: Diagnosis not present

## 2019-12-02 DIAGNOSIS — R300 Dysuria: Secondary | ICD-10-CM | POA: Diagnosis not present

## 2019-12-02 DIAGNOSIS — E782 Mixed hyperlipidemia: Secondary | ICD-10-CM | POA: Diagnosis not present

## 2019-12-02 DIAGNOSIS — D72829 Elevated white blood cell count, unspecified: Secondary | ICD-10-CM | POA: Diagnosis not present

## 2019-12-03 ENCOUNTER — Non-Acute Institutional Stay: Payer: Medicare Other | Admitting: Internal Medicine

## 2019-12-03 DIAGNOSIS — N183 Chronic kidney disease, stage 3 unspecified: Secondary | ICD-10-CM | POA: Diagnosis not present

## 2019-12-03 DIAGNOSIS — J189 Pneumonia, unspecified organism: Secondary | ICD-10-CM | POA: Diagnosis not present

## 2019-12-03 DIAGNOSIS — Z515 Encounter for palliative care: Secondary | ICD-10-CM | POA: Diagnosis not present

## 2019-12-03 DIAGNOSIS — N390 Urinary tract infection, site not specified: Secondary | ICD-10-CM

## 2019-12-03 DIAGNOSIS — L89154 Pressure ulcer of sacral region, stage 4: Secondary | ICD-10-CM

## 2019-12-03 DIAGNOSIS — R410 Disorientation, unspecified: Secondary | ICD-10-CM | POA: Diagnosis not present

## 2019-12-03 DIAGNOSIS — D72829 Elevated white blood cell count, unspecified: Secondary | ICD-10-CM | POA: Diagnosis not present

## 2019-12-04 ENCOUNTER — Other Ambulatory Visit: Payer: Self-pay

## 2019-12-04 DIAGNOSIS — N319 Neuromuscular dysfunction of bladder, unspecified: Secondary | ICD-10-CM | POA: Diagnosis not present

## 2019-12-04 DIAGNOSIS — N179 Acute kidney failure, unspecified: Secondary | ICD-10-CM | POA: Diagnosis not present

## 2019-12-04 DIAGNOSIS — Z87891 Personal history of nicotine dependence: Secondary | ICD-10-CM | POA: Diagnosis not present

## 2019-12-04 DIAGNOSIS — Z7401 Bed confinement status: Secondary | ICD-10-CM | POA: Diagnosis not present

## 2019-12-04 DIAGNOSIS — Z9981 Dependence on supplemental oxygen: Secondary | ICD-10-CM | POA: Diagnosis not present

## 2019-12-04 DIAGNOSIS — L89154 Pressure ulcer of sacral region, stage 4: Secondary | ICD-10-CM | POA: Diagnosis not present

## 2019-12-04 DIAGNOSIS — Z96641 Presence of right artificial hip joint: Secondary | ICD-10-CM | POA: Diagnosis not present

## 2019-12-04 DIAGNOSIS — D649 Anemia, unspecified: Secondary | ICD-10-CM | POA: Diagnosis not present

## 2019-12-04 DIAGNOSIS — I10 Essential (primary) hypertension: Secondary | ICD-10-CM | POA: Diagnosis not present

## 2019-12-04 DIAGNOSIS — I2510 Atherosclerotic heart disease of native coronary artery without angina pectoris: Secondary | ICD-10-CM | POA: Diagnosis not present

## 2019-12-04 DIAGNOSIS — R6520 Severe sepsis without septic shock: Secondary | ICD-10-CM | POA: Diagnosis not present

## 2019-12-04 DIAGNOSIS — M24541 Contracture, right hand: Secondary | ICD-10-CM | POA: Diagnosis not present

## 2019-12-04 DIAGNOSIS — N390 Urinary tract infection, site not specified: Secondary | ICD-10-CM | POA: Diagnosis not present

## 2019-12-04 DIAGNOSIS — A419 Sepsis, unspecified organism: Secondary | ICD-10-CM | POA: Diagnosis not present

## 2019-12-04 DIAGNOSIS — Z6832 Body mass index (BMI) 32.0-32.9, adult: Secondary | ICD-10-CM | POA: Diagnosis not present

## 2019-12-04 DIAGNOSIS — M1990 Unspecified osteoarthritis, unspecified site: Secondary | ICD-10-CM | POA: Diagnosis not present

## 2019-12-04 DIAGNOSIS — E785 Hyperlipidemia, unspecified: Secondary | ICD-10-CM | POA: Diagnosis not present

## 2019-12-04 DIAGNOSIS — F329 Major depressive disorder, single episode, unspecified: Secondary | ICD-10-CM | POA: Diagnosis not present

## 2019-12-04 DIAGNOSIS — R300 Dysuria: Secondary | ICD-10-CM | POA: Diagnosis not present

## 2019-12-04 DIAGNOSIS — Z960 Presence of urogenital implants: Secondary | ICD-10-CM | POA: Diagnosis not present

## 2019-12-04 DIAGNOSIS — Z933 Colostomy status: Secondary | ICD-10-CM | POA: Diagnosis not present

## 2019-12-04 DIAGNOSIS — J189 Pneumonia, unspecified organism: Secondary | ICD-10-CM | POA: Diagnosis not present

## 2019-12-04 DIAGNOSIS — M24542 Contracture, left hand: Secondary | ICD-10-CM | POA: Diagnosis not present

## 2019-12-04 DIAGNOSIS — E119 Type 2 diabetes mellitus without complications: Secondary | ICD-10-CM | POA: Diagnosis not present

## 2019-12-04 DIAGNOSIS — D696 Thrombocytopenia, unspecified: Secondary | ICD-10-CM | POA: Diagnosis not present

## 2019-12-04 DIAGNOSIS — Z741 Need for assistance with personal care: Secondary | ICD-10-CM | POA: Diagnosis not present

## 2019-12-04 NOTE — Progress Notes (Signed)
[facility] Consult Note Telephone: [phone]  Fax: [phone]  PATIENT NAME: Phillip Grant DOB: 1941/08/12 MRN: [id number]  PRIMARY CARE PROVIDER:   Dr. Merilynn Finland  REFERRING PROVIDER:  Dr. Merilynn Finland  RESPONSIBLE PARTY:   Self and wife  Phillip Grant,Phillip Grant  [phone]    RECOMMENDATIONS and PLAN:  Palliative care encounter Z51.5  1.  Advance care planning:  Readdressed goals of care and advanced directives with patient and wife.  Both agree with focusing on comfort measures at this time.  He will complete antibiotics and current clysis therapies for treatment of pneumonia. They also agreed that patient is not to return to the hospital for any type of illness. DNR and MOST forms in place in chart. They are aware that patient is not improving even with the use of dual antibiotics Rocephin and Azithromycin.  Presented the offering of comfort care with transition to  Hospice care which couple is in agreement with.  Phone call to Dr. Gildardo Grant and Phillip Grant who agree that patient is eligible for Hospice with current trajectory of decline. Hospice referral order obtained from [facility] and [facility] referral team was notified. Support given.  Palliative care will continue to follow until pt is admitted under Hospice care.   2. Pneumonia with hypoxia:  Complete antibiotic therapy.  No plans for advanced therapies.  Supplemental oxygen use to titrate saturations to 90% or greater as patient is nearing end of life.  Begin Morphine concentrate solution (20mg /ml) 5mg  Grant q 4hrs prn shortness of breath or pain( Morphine is listed as an allergy and patient reports symptoms were remote leg pain without any anaphylactic reactions.  He agrees to use Morphine to help manage his terminal symptoms). Noted dysphagia with attempts to administer Oxycodone 5mg  tablet.  3.  Pain:  Chronic and exacerbated by terminal decline.  Begin use of Morphine as listed above.    Further adjustments will be made by Hospice team.  4.  Anorexia:  Related to terminal debilitation and increased respiratory needs.  Comfort foods/beverages.  Oral care every 2 hours and prn.  I spent 90 minutes providing this consultation,  from 1430 to 1600. More than 50% of the time in this consultation was spent coordinating communication with patient, spouse, clinical staff.   HISTORY OF PRESENT ILLNESS: Follow-up for Phillip Grant.  Received a phone call from nurse Phillip Grant who states that patient is in a state of decline and is currently being treated for pneumonia and a UTI.  He has ceased eating, is receiving normal saline(1 liter) via clysis related to dehydration and worsening of renal function. He chronically has an indwelling foley catheter related to urinary retention, is bedridden due to severe spinal stenosis(functional quadraplegia) and also has a stage IV sacral decubitus (last measurements were 1.5x2.0cm).  Pt has refused personal care, turning and dressing changes of decub.  Unknown weight due to multiple refusals to weigh. Palliative care was consulted to assist with goals of care until admission into Hospice care.   Pertinent Labs:                               BUN/CREA                    GFR        CO2        WBC        PLT  CXR Date:                  11/30/99                   41/1.89                         33             23           24.1          251              Patchy infiltrate on left.  No vascular congestion.                            12/02/99                   78/3.73(on clysis)        15             18           22.3          83  CODE STATUS: DNAR/DNI  PPS: 20%  Sips of water with medications  HOSPICE ELIGIBILITY/DIAGNOSIS: YES/Pneumonia with hypoxia, acute renal insufficiency, Stage IV sacral decubitus.  No plans for escalation of care  PAST MEDICAL HISTORY:  Past Medical History:  Diagnosis Date  . Depression   . Diabetes mellitus without  complication (HCC)    NIDDM   . GERD (gastroesophageal reflux disease)   . Hyperlipidemia   . Hypertension   . Neurogenic bladder   . Osteoarthritis     SOCIAL HX:  Long term resident of SNF.  Married, 2 adult sons ([place] and [place])  ALLERGIES:  Allergies  Allergen Reactions  . Acyclovir And Related   . Macrobid [Nitrofurantoin Macrocrystal]     Documented on MAR  . Morphine And Related     Documented on MAR     PERTINENT MEDICATIONS:  Outpatient Encounter Medications as of 12/03/2019  Medication Sig  . bisacodyl (DULCOLAX) 10 MG suppository Place 1 suppository (10 mg total) rectally daily as needed for moderate constipation.  . docusate sodium (COLACE) 100 MG capsule Take 1 capsule (100 mg total) by mouth 2 (two) times daily.  . DULoxetine (CYMBALTA) 20 MG capsule Take 20 mg by mouth daily.  . fentaNYL (DURAGESIC - DOSED MCG/HR) 25 MCG/HR patch Place 25 mcg onto the skin every 3 (three) days.  . Menthol-Zinc Oxide (CALMOSEPTINE) 0.44-20.6 % OINT Apply 1 application topically daily. Apply to sacrum and buttocks  . methocarbamol (ROBAXIN) 500 MG tablet Take 1 tablet (500 mg total) by mouth every 6 (six) hours as needed for muscle spasms.  Marland Kitchen oxyCODONE (OXY IR/ROXICODONE) 5 MG immediate release tablet Take 1-2 tablets (5-10 mg total) by mouth every 6 (six) hours as needed for moderate pain (pain score 4-6).   No facility-administered encounter medications on file as of 12/03/2019.   PHYSICAL EXAM:   General: Terminally ill, frail appearing, lethargic.  Yells out when gently touched or muscle spasm EENT:  Very dry mucus membranes Cardiovascular: regular rate and rhythm @ 69/min Pulmonary: Forced speech. Pulmonary congestion with cough.  Unable to attain saturations.  O2 via 3L/min in use Abdomen: soft, nontender, + bowel sounds.  Colostomy in place GU: foley catheter patent of clear amber urine Extremities: no edema, no joint deformities Skin: Cyanosis  of all fingertips and  toes.  Dusky and pale in color. Did not visualize sacral wound Neurological: Somnolent, weakness.  Arouses to name.  Answers questions in 1-2 words appropriately  Phillip Sheffield, NP-C

## 2019-12-05 DIAGNOSIS — N390 Urinary tract infection, site not specified: Secondary | ICD-10-CM | POA: Diagnosis not present

## 2019-12-05 DIAGNOSIS — N179 Acute kidney failure, unspecified: Secondary | ICD-10-CM | POA: Diagnosis not present

## 2019-12-05 DIAGNOSIS — L89154 Pressure ulcer of sacral region, stage 4: Secondary | ICD-10-CM | POA: Diagnosis not present

## 2019-12-05 DIAGNOSIS — A419 Sepsis, unspecified organism: Secondary | ICD-10-CM | POA: Diagnosis not present

## 2019-12-05 DIAGNOSIS — R6520 Severe sepsis without septic shock: Secondary | ICD-10-CM | POA: Diagnosis not present

## 2019-12-05 DIAGNOSIS — J189 Pneumonia, unspecified organism: Secondary | ICD-10-CM | POA: Diagnosis not present

## 2019-12-07 DIAGNOSIS — R6520 Severe sepsis without septic shock: Secondary | ICD-10-CM | POA: Diagnosis not present

## 2019-12-07 DIAGNOSIS — N390 Urinary tract infection, site not specified: Secondary | ICD-10-CM | POA: Diagnosis not present

## 2019-12-07 DIAGNOSIS — L89154 Pressure ulcer of sacral region, stage 4: Secondary | ICD-10-CM | POA: Diagnosis not present

## 2019-12-07 DIAGNOSIS — N179 Acute kidney failure, unspecified: Secondary | ICD-10-CM | POA: Diagnosis not present

## 2019-12-07 DIAGNOSIS — A419 Sepsis, unspecified organism: Secondary | ICD-10-CM | POA: Diagnosis not present

## 2019-12-07 DIAGNOSIS — J189 Pneumonia, unspecified organism: Secondary | ICD-10-CM | POA: Diagnosis not present

## 2019-12-10 DEATH — deceased
